# Patient Record
Sex: Male | Born: 1980 | Race: White | Hispanic: No | Smoking: Never smoker
Health system: Southern US, Community
[De-identification: ages and names within clinical notes are randomized; demographics above are authoritative.]

## PROBLEM LIST (undated history)

## (undated) DIAGNOSIS — R569 Unspecified convulsions: Principal | ICD-10-CM

## (undated) DIAGNOSIS — F329 Major depressive disorder, single episode, unspecified: Secondary | ICD-10-CM

## (undated) DIAGNOSIS — F32A Depression, unspecified: Secondary | ICD-10-CM

## (undated) DIAGNOSIS — F419 Anxiety disorder, unspecified: Secondary | ICD-10-CM

## (undated) HISTORY — DX: Depression, unspecified: F32.A

## (undated) HISTORY — DX: Unspecified convulsions: R56.9

## (undated) HISTORY — DX: Major depressive disorder, single episode, unspecified: F32.9

## (undated) HISTORY — DX: Anxiety disorder, unspecified: F41.9

---

## 2004-09-27 HISTORY — PX: CYST REMOVAL NECK: SHX6281

## 2017-03-07 ENCOUNTER — Ambulatory Visit (INDEPENDENT_AMBULATORY_CARE_PROVIDER_SITE_OTHER): Payer: BLUE CROSS/BLUE SHIELD | Admitting: Physician Assistant

## 2017-03-07 ENCOUNTER — Encounter: Payer: Self-pay | Admitting: Physician Assistant

## 2017-03-07 VITALS — BP 130/86 | HR 74 | Temp 98.3°F | Ht 69.5 in | Wt 212.0 lb

## 2017-03-07 DIAGNOSIS — Z23 Encounter for immunization: Secondary | ICD-10-CM

## 2017-03-07 DIAGNOSIS — Z114 Encounter for screening for human immunodeficiency virus [HIV]: Secondary | ICD-10-CM

## 2017-03-07 DIAGNOSIS — F419 Anxiety disorder, unspecified: Secondary | ICD-10-CM | POA: Diagnosis not present

## 2017-03-07 DIAGNOSIS — F321 Major depressive disorder, single episode, moderate: Secondary | ICD-10-CM

## 2017-03-07 LAB — CBC WITH DIFFERENTIAL/PLATELET
BASOS ABS: 0 10*3/uL (ref 0.0–0.1)
Basophils Relative: 0.6 % (ref 0.0–3.0)
EOS ABS: 0 10*3/uL (ref 0.0–0.7)
Eosinophils Relative: 0.7 % (ref 0.0–5.0)
HEMATOCRIT: 44.9 % (ref 39.0–52.0)
HEMOGLOBIN: 15.5 g/dL (ref 13.0–17.0)
LYMPHS PCT: 24.4 % (ref 12.0–46.0)
Lymphs Abs: 1.5 10*3/uL (ref 0.7–4.0)
MCHC: 34.6 g/dL (ref 30.0–36.0)
MCV: 94.4 fl (ref 78.0–100.0)
MONO ABS: 0.4 10*3/uL (ref 0.1–1.0)
Monocytes Relative: 6.6 % (ref 3.0–12.0)
NEUTROS ABS: 4 10*3/uL (ref 1.4–7.7)
Neutrophils Relative %: 67.7 % (ref 43.0–77.0)
PLATELETS: 246 10*3/uL (ref 150.0–400.0)
RBC: 4.76 Mil/uL (ref 4.22–5.81)
RDW: 13.4 % (ref 11.5–15.5)
WBC: 5.9 10*3/uL (ref 4.0–10.5)

## 2017-03-07 LAB — COMPREHENSIVE METABOLIC PANEL
ALBUMIN: 4.6 g/dL (ref 3.5–5.2)
ALT: 25 U/L (ref 0–53)
AST: 20 U/L (ref 0–37)
Alkaline Phosphatase: 40 U/L (ref 39–117)
BILIRUBIN TOTAL: 0.6 mg/dL (ref 0.2–1.2)
BUN: 14 mg/dL (ref 6–23)
CALCIUM: 10 mg/dL (ref 8.4–10.5)
CO2: 29 mEq/L (ref 19–32)
CREATININE: 1.02 mg/dL (ref 0.40–1.50)
Chloride: 103 mEq/L (ref 96–112)
GFR: 89.08 mL/min (ref >60.00)
Glucose, Bld: 108 mg/dL — ABNORMAL HIGH (ref 70–99)
Potassium: 4.1 mEq/L (ref 3.5–5.1)
Sodium: 140 mEq/L (ref 135–145)
Total Protein: 7.5 g/dL (ref 6.0–8.3)

## 2017-03-07 LAB — TSH: TSH: 1.07 u[IU]/mL (ref 0.35–4.50)

## 2017-03-07 MED ORDER — SERTRALINE HCL 25 MG PO TABS: 25.0000 mg | ORAL_TABLET | Freq: Every day | ORAL | 0 refills | Status: DC

## 2017-03-07 NOTE — Patient Instructions (Addendum)
It was great meeting you today!  We will call you with your lab results.  Based on today's visit I would like for you to: 1. Start taking 25 mg zoloft daily. Take before bed. After one week, you may increase to 50 mg, if well tolerated. 2. Make an appointment with your therapist! 3. Work on reaching out to friends and limiting any excessive alcohol intake.  Follow-up with me in 2-3 weeks, sooner if needed.  If you are feeling suicidal, please call [phone] and go to the emergency room!

## 2017-03-07 NOTE — Progress Notes (Signed)
Jordan Ryan is a 36 y.o. male here for to Establish Care.  I acted as a Neurosurgeonscribe for [employer] CorporationSamantha Halima Fogal, PA-C Corky Mullonna Orphanos, LPN  History of Present Illness:   Chief Complaint  Patient presents with  . Establish Care  . Anxiety    panic attacks  . Depression   Acute Concerns: Depression -- prior to separation in January, was doing well with his mood, no prior history of depression or anxiety. He never had issues with motivation. Now he reports that if he does not have his kids, he has difficulty getting out of bed, he does not go to the gym anymore, he has increased his ETOH intake. Drinks about 10-12 a week, no blackouts. He denies suicidal thoughts. He does have a good support system. He has had difficulty concentrating at work. He has been going to therapy twice a week since January however he has not over the past month.  Anxiety -- last Thursday had some shaking of arms, heart palpitations, SOB, tunnel vision, occurred at his daughter's graduation. This also occurred after his wife told him that she was leaving him. Has not had any further panic attacks since.  Chronic Issues: None  Health Maintenance: Immunizations -- needs Tdap Sleep habits -- sleeping is erratic since January, has not taken anything to help him sleep Exercise -- variable, occasional hiking, doesn't really use his gym membership Weight -- Weight: 212 lb (96.2 kg) -- 195 lb is ideal weight that he would like to be at  Depression screen PHQ 2/9 03/07/2017  Decreased Interest 2  Down, Depressed, Hopeless 2  PHQ - 2 Score 4  Altered sleeping 3  Tired, decreased energy 3  Change in appetite 2  Feeling bad or failure about yourself  3  Trouble concentrating 2  Moving slowly or fidgety/restless 0  Suicidal thoughts 0  PHQ-9 Score 17   GAD 7 : Generalized Anxiety Score 03/07/2017  Nervous, Anxious, on Edge 2  Control/stop worrying 2  Worry too much - different things 2  Trouble relaxing 3  Restless 0  Easily  annoyed or irritable 1  Afraid - awful might happen 0  Total GAD 7 Score 10  Anxiety Difficulty Very difficult     Other providers/specialists: January has been seeing therapist -- Lendon ColonelSharon Deach   PMHx, SurgHx, SocialHx, Medications, and Allergies were reviewed in the Visit Navigator and updated as appropriate.  Current Medications:   Current Outpatient Prescriptions:  .  acetaminophen (TYLENOL) 500 MG tablet, Take 500 mg by mouth as needed., Disp: , Rfl:  .  ibuprofen (ADVIL,MOTRIN) 200 MG tablet, Take 400 mg by mouth every 6 (six) hours as needed., Disp: , Rfl:  .  OVER THE COUNTER MEDICATION, Take 1 packet by mouth daily. [employer] Sport Pack Vitamins, Disp: , Rfl:  .  sertraline (ZOLOFT) 25 MG tablet, Take 1 tablet (25 mg total) by mouth daily., Disp: 30 tablet, Rfl: 0   Review of Systems:   Review of Systems  Constitutional: Positive for malaise/fatigue. Negative for chills, fever and weight loss.  HENT: Negative for hearing loss, sinus pain and sore throat.   Eyes: Negative for blurred vision.  Respiratory: Negative for cough and shortness of breath.   Cardiovascular: Negative for chest pain, palpitations and leg swelling.  Gastrointestinal: Negative for abdominal pain, constipation, diarrhea, heartburn and nausea.  Genitourinary: Negative for dysuria, frequency and urgency.  Musculoskeletal: Negative for back pain and myalgias.       RIght side neck when wakes up  in the morning.  Skin: Negative for itching and rash.  Neurological: Positive for tingling. Negative for dizziness, seizures, loss of consciousness and headaches.       During panic attacks  Endo/Heme/Allergies: Negative for polydipsia.  Psychiatric/Behavioral: Positive for depression. Negative for hallucinations and suicidal ideas. The patient is nervous/anxious and has insomnia.        Panic attacks    Vitals:   Vitals:   03/07/17 1436  BP: 130/86  Pulse: 74  Temp: 98.3 F (36.8 C)  TempSrc: Oral  SpO2: 97%   Weight: 212 lb (96.2 kg)  Height: 5' 9.5" (1.765 m)     Body mass index is 30.86 kg/m.  Physical Exam:   Physical Exam  Constitutional: He appears well-developed. He is cooperative.  Non-toxic appearance. He does not have a sickly appearance. He does not appear ill. No distress.  Cardiovascular: Normal rate, regular rhythm, S1 normal, S2 normal, normal heart sounds and normal pulses.   No LE edema  Pulmonary/Chest: Effort normal and breath sounds normal.  Neurological: He is alert.  Psychiatric: His speech is normal and behavior is normal. Thought content normal. His mood appears anxious. He exhibits a depressed mood.  Tearful throughout encounter  Nursing note and vitals reviewed.    Assessment and Plan:    Jordan Ryan was seen today for establish care, anxiety and depression.  Diagnoses and all orders for this visit:  Anxiety Patient is agreeable to trying Zoloft. We discussed use of support system, exercise and reduction of alcohol intake. Follow-up in 2-3 weeks. We discussed avoiding use of benzos for concerns of dependency. Will also order routine labs. -     Comprehensive metabolic panel -     CBC with Differential/Platelet -     TSH  Depression, major, single episode, moderate (HCC) Start Zoloft 25 mg. After 1 week, may increase to 50 mg. Follow-up with me in 2-3 weeks. Start going back to therapy. Work on decreasing alcohol intake. Currently not suicidal. Follow-up with me in 2-3 weeks. Sooner if needed. If suicidal thoughts occur, please go to the ER, suicide hotline number provided. Will also order routine labs. -     Comprehensive metabolic panel -     CBC with Differential/Platelet -     TSH  Encounter for screening for HIV -     HIV antibody  Need for prophylactic vaccination with combined diphtheria-tetanus-pertussis (DTP) vaccine -     Tdap vaccine greater than or equal to 7yo IM  Other orders -     sertraline (ZOLOFT) 25 MG tablet; Take 1 tablet (25 mg total)  by mouth daily.    . Reviewed expectations re: course of current medical issues. . Discussed self-management of symptoms. . Outlined signs and symptoms indicating need for more acute intervention. . Patient verbalized understanding and all questions were answered. . See orders for this visit as documented in the electronic medical record. . Patient received an After-Visit Summary.  CMA or LPN served as scribe during this visit. History, Physical, and Plan performed by medical provider. Documentation and orders reviewed and attested to.  Jarold Motto, PA-C

## 2017-03-08 LAB — HIV ANTIBODY (ROUTINE TESTING W REFLEX): HIV 1&2 Ab, 4th Generation: NONREACTIVE

## 2017-03-28 ENCOUNTER — Encounter: Payer: Self-pay | Admitting: Physician Assistant

## 2017-03-28 ENCOUNTER — Ambulatory Visit (INDEPENDENT_AMBULATORY_CARE_PROVIDER_SITE_OTHER): Payer: BLUE CROSS/BLUE SHIELD | Admitting: Physician Assistant

## 2017-03-28 DIAGNOSIS — F32A Depression, unspecified: Secondary | ICD-10-CM | POA: Insufficient documentation

## 2017-03-28 DIAGNOSIS — F419 Anxiety disorder, unspecified: Secondary | ICD-10-CM | POA: Insufficient documentation

## 2017-03-28 DIAGNOSIS — F329 Major depressive disorder, single episode, unspecified: Secondary | ICD-10-CM

## 2017-03-28 MED ORDER — SERTRALINE HCL 25 MG PO TABS: 25.0000 mg | ORAL_TABLET | Freq: Every day | ORAL | 5 refills | Status: DC

## 2017-03-28 NOTE — Progress Notes (Signed)
Jordan Ryan is a 36 y.o. male is here for 3 week follow up for Anxiety and Depression.  I acted as a Neurosurgeon for [employer], PA-C Corky Mull, LPN  History of Present Illness:   Chief Complaint  Patient presents with  . Follow-up    3 weeks  . Anxiety  . Depression    Anxiety  Presents for follow-up (Pt here today for 3 week ) visit. Patient reports no chest pain, decreased concentration, depressed mood, dizziness, dry mouth, excessive worry, hyperventilation, insomnia, palpitations, panic, restlessness or suicidal ideas. Primary symptoms comment: Pt is feeling much better since on medicaiton Zoloft symptoms have resolved , sleeping much better.. Symptoms occur rarely. The severity of symptoms is mild. The quality of sleep is good. Nighttime awakenings: none.   Compliance with medications: Pt has missed 5 or 6 doses the past 30 days. Treatment side effects: He is tolerating Zoloft well with out any side effects.  Depression       (Pt is feeling much better since on medicaiton Zoloft symptoms have resolved , sleeping much better.)  This is a recurrent problem.  The current episode started more than 1 month ago.   The problem has been rapidly improving since onset.  Associated symptoms include no decreased concentration, no fatigue, no helplessness, no hopelessness, does not have insomnia, not irritable, no restlessness, no decreased interest, no appetite change, no headaches, no indigestion, not sad and no suicidal ideas.     The symptoms are aggravated by family issues and work stress.  Compliance with treatment is good.  Previous treatment provided significant relief.  Past medical history includes anxiety.    Wt Readings from Last 3 Encounters:  03/28/17 210 lb (95.3 kg)  03/07/17 212 lb (96.2 kg)   Since last seeing me has has decreased ETOH intake, started to go back to the gym and has been hanging out with friends.  Depression screen [facility] 2/9 03/28/2017 03/07/2017  Decreased  Interest 0 2  Down, Depressed, Hopeless 0 2  PHQ - 2 Score 0 4  Altered sleeping 0 3  Tired, decreased energy 0 3  Change in appetite 0 2  Feeling bad or failure about yourself  0 3  Trouble concentrating 0 2  Moving slowly or fidgety/restless 0 0  Suicidal thoughts 0 0  PHQ-9 Score 0 17   GAD 7 : Generalized Anxiety Score 03/28/2017 03/07/2017  Nervous, Anxious, on Edge 0 2  Control/stop worrying 0 2  Worry too much - different things 0 2  Trouble relaxing 0 3  Restless 0 0  Easily annoyed or irritable 1 1  Afraid - awful might happen 0 0  Total GAD 7 Score 1 10  Anxiety Difficulty Not difficult at all Very difficult      There are no preventive care reminders to display for this patient.  Past Medical History:  Diagnosis Date  . Anxiety   . Depression      Social History   Social History  . Marital status: Divorced    Spouse name: N/A  . Number of children: N/A  . Years of education: N/A   Occupational History  . Not on file.   Social History Main Topics  . Smoking status: Never Smoker  . Smokeless tobacco: Former Neurosurgeon    Quit date: 09/28/2011  . Alcohol use 6.0 - 7.2 oz/week    10 - 12 Cans of beer per week  . Drug use: No  . Sexual activity: Yes  Other Topics Concern  . Not on file   Social History Narrative   Advertising account planner   From [place]    Going through a divorce   Twin 7 year olds and 34 year old girls   Fun: play guitar, hiking, distractions    Past Surgical History:  Procedure Laterality Date  . CYST REMOVAL NECK Left 2006   epidermal inclusion cyst    Family History  Problem Relation Age of Onset  . Aneurysm Mother   . Depression Mother   . Depression Brother   . Diabetes Maternal Grandfather     PMHx, SurgHx, SocialHx, FamHx, Medications, and Allergies were reviewed in the Visit Navigator and updated as appropriate.   Patient Active Problem List   Diagnosis Date Noted  . Anxiety 03/28/2017  . Depression 03/28/2017     Social History  Substance Use Topics  . Smoking status: Never Smoker  . Smokeless tobacco: Former Neurosurgeon    Quit date: 09/28/2011  . Alcohol use 6.0 - 7.2 oz/week    10 - 12 Cans of beer per week    Current Medications and Allergies:    Current Outpatient Prescriptions:  .  acetaminophen (TYLENOL) 500 MG tablet, Take 500 mg by mouth as needed., Disp: , Rfl:  .  ibuprofen (ADVIL,MOTRIN) 200 MG tablet, Take 400 mg by mouth every 6 (six) hours as needed., Disp: , Rfl:  .  OVER THE COUNTER MEDICATION, Take 1 packet by mouth daily. [employer] Sport Pack Vitamins, Disp: , Rfl:  .  sertraline (ZOLOFT) 25 MG tablet, Take 1 tablet (25 mg total) by mouth daily., Disp: 30 tablet, Rfl: 5  No Known Allergies  Review of Systems   Review of Systems  Constitutional: Negative for appetite change and fatigue.  Cardiovascular: Negative for chest pain and palpitations.  Neurological: Negative for dizziness and headaches.  Psychiatric/Behavioral: Positive for depression. Negative for decreased concentration and suicidal ideas. The patient does not have insomnia.     Vitals:   Vitals:   03/28/17 1603  BP: 130/90  Pulse: 73  Temp: 98.9 F (37.2 C)  TempSrc: Oral  SpO2: 97%  Weight: 210 lb (95.3 kg)  Height: 5' 9.5" (1.765 m)     Body mass index is 30.57 kg/m.   Physical Exam:    Physical Exam  Constitutional: He appears well-developed. He is not irritable and cooperative.  Non-toxic appearance. He does not have a sickly appearance. He does not appear ill. No distress.  Cardiovascular: Normal rate, regular rhythm, S1 normal, S2 normal, normal heart sounds and normal pulses.   No LE edema  Pulmonary/Chest: Effort normal and breath sounds normal.  Neurological: He is alert.  Psychiatric: He has a normal mood and affect. His speech is normal and behavior is normal.  Pleasant and cheerful  Nursing note and vitals reviewed.    Assessment and Plan:    Problem List Items Addressed This  Visit      Other   Anxiety    Tolerating 25 mg Zoloft well. GAD-7 today is a 1, was a 10 about 3-4 weeks ago. Continue treatment. Follow-up in 6 months, sooner if needed.      Relevant Medications   sertraline (ZOLOFT) 25 MG tablet   Depression    Tolerating 25 mg Zoloft. PHQ-9 was 17 at prior visit approximately 4 weeks ago, now is 0. Follow-up in 6 months, sooner if needed.      Relevant Medications   sertraline (ZOLOFT) 25 MG tablet       .  Reviewed expectations re: course of current medical issues. . Discussed self-management of symptoms. . Outlined signs and symptoms indicating need for more acute intervention. . Patient verbalized understanding and all questions were answered. . See orders for this visit as documented in the electronic medical record. . Patient received an After Visit Summary.  CMA or LPN served as scribe during this visit. History, Physical, and Plan performed by medical provider. Documentation and orders reviewed and attested to.  Jarold MottoSamantha Hilarie Sinha, PA-C [place] [place] 03/28/2017  Follow-up: Return in about 6 months (around 09/28/2017) for anxiety.

## 2017-03-28 NOTE — Assessment & Plan Note (Signed)
Tolerating 25 mg Zoloft. PHQ-9 was 17 at prior visit approximately 4 weeks ago, now is 0. Follow-up in 6 months, sooner if needed.

## 2017-03-28 NOTE — Assessment & Plan Note (Signed)
Tolerating 25 mg Zoloft well. GAD-7 today is a 1, was a 10 about 3-4 weeks ago. Continue treatment. Follow-up in 6 months, sooner if needed.

## 2017-03-28 NOTE — Patient Instructions (Signed)
It was great to see you!  Please make an appointment to see us in about 6 months, sooner if needed.  If you decide to go off of the zoloft, split the tablet in half and take half of a tablet daily for 1 week, and then take 1/2 tab every other day for one week, then discontinue.  If you decide to increase zoloft, you can double your dosage to 50 mg daily. Let me know if you do this and if you would like a new prescription for this.  You will be contacted about your referral to general surgery.

## 2017-05-16 ENCOUNTER — Telehealth: Payer: Self-pay | Admitting: Physician Assistant

## 2017-05-16 ENCOUNTER — Other Ambulatory Visit: Payer: Self-pay | Admitting: Physician Assistant

## 2017-05-16 MED ORDER — SERTRALINE HCL 50 MG PO TABS: 50.0000 mg | ORAL_TABLET | Freq: Every day | ORAL | 1 refills | Status: DC

## 2017-05-16 NOTE — Telephone Encounter (Signed)
Called and left detailed message on voicemail.

## 2017-05-16 NOTE — Telephone Encounter (Signed)
I have sent in a new prescription of 50 mg daily Zoloft.  If this is ineffective for his anxiety, and he continues to have breakthrough anxiety with this medication, I would like for him to make an office visit with me.  Thanks!  Jarold Motto PA-C, 05/16/17

## 2017-05-16 NOTE — Telephone Encounter (Signed)
MEDICATION: sertraline (ZOLOFT) 25 MG tablet  PHARMACY:  [id number] - [place], [place] - [address] [phone] (Phone) [phone] (Fax)   IS THIS A 90 DAY SUPPLY : yes  IS PATIENT OUT OF MEDICTAION: yes  IF NOT; HOW MUCH IS LEFT: yes  LAST APPOINTMENT DATE:03/28/17  NEXT APPOINTMENT DATE:09/29/16  OTHER COMMENTS: patient is requesting a higher dosage, thinks he needs to take 2 to avoid panic attacks. Call to advise if necessary.   **Let patient know to contact pharmacy at the end of the day to make sure medication is ready. **  ** Please notify patient to allow 48-72 hours to process**  **Encourage patient to contact the pharmacy for refills or they can request refills through [facility]**

## 2017-08-22 ENCOUNTER — Other Ambulatory Visit: Payer: Self-pay | Admitting: *Deleted

## 2017-08-22 MED ORDER — SERTRALINE HCL 50 MG PO TABS: 50.0000 mg | ORAL_TABLET | Freq: Every day | ORAL | 0 refills | Status: DC

## 2017-09-29 ENCOUNTER — Ambulatory Visit: Payer: BLUE CROSS/BLUE SHIELD | Admitting: Physician Assistant

## 2017-12-05 ENCOUNTER — Encounter: Payer: Self-pay | Admitting: Physician Assistant

## 2017-12-05 ENCOUNTER — Ambulatory Visit: Payer: BLUE CROSS/BLUE SHIELD | Admitting: Physician Assistant

## 2017-12-05 VITALS — BP 148/98 | HR 93 | Temp 97.9°F | Ht 69.5 in | Wt 234.0 lb

## 2017-12-05 DIAGNOSIS — R229 Localized swelling, mass and lump, unspecified: Secondary | ICD-10-CM

## 2017-12-05 DIAGNOSIS — F321 Major depressive disorder, single episode, moderate: Secondary | ICD-10-CM

## 2017-12-05 DIAGNOSIS — F419 Anxiety disorder, unspecified: Secondary | ICD-10-CM

## 2017-12-05 MED ORDER — SERTRALINE HCL 50 MG PO TABS: 50.0000 mg | ORAL_TABLET | Freq: Every day | ORAL | 1 refills | Status: DC

## 2017-12-05 NOTE — Progress Notes (Signed)
Jordan Ryan is a 37 y.o. male is here to discuss: Anxiety  I acted as a Neurosurgeon for [employer], PA-C Jordan Mull, LPN  History of Present Illness:   Chief Complaint  Patient presents with  . Anixety  . Depression    Anxiety  Presents for follow-up visit. Symptoms include decreased concentration, depressed mood, dry mouth, insomnia, malaise and restlessness. Patient reports no chest pain, compulsions, confusion, dizziness, excessive worry, feeling of choking, irritability, muscle tension, nausea, nervous/anxious behavior, obsessions, palpitations, panic, shortness of breath or suicidal ideas. Symptoms occur rarely. The most recent episode lasted 2 hours. The severity of symptoms is mild. The quality of sleep is good. Nighttime awakenings: occasional.   His past medical history is significant for depression. Compliance with medications is 26-50%.  Depression       The patient presents with depression.  This is a chronic problem.  The problem occurs intermittently.  The problem has been gradually improving since onset.  Associated symptoms include decreased concentration, fatigue, insomnia and restlessness.  Associated symptoms include no helplessness, no hopelessness, not irritable, no decreased interest, no appetite change, no body aches, no myalgias, no headaches, no indigestion, not sad and no suicidal ideas.     The symptoms are aggravated by family issues.  Compliance with treatment is variable.  Past compliance problems: Pt is forgetting to take medication everyday and feels sometimes like he does not need it.  Previous treatment provided moderate relief.  Past medical history includes anxiety and depression.    Bumps Patient reports that he has fatty cysts on his R forearm and back. We briefly discussed this during July 2018 visit but a referral to general surgery was not placed. He would like evaluation of these for possible excision. They are non-tender. Denies unintentional  weight loss, fever, night sweats, unusual fatigue.  He has been working on avoiding alcohol. Ways to decompress include: watching TV.  He does note that he struggles with a little bit of compulsive tendencies, especially when it comes to keeping his house clean. States that his ex-wife had OCD tendencies when it came to cleaning the house and he is trying to be a little more relaxed about it.   Depression screen [facility] 2/9 12/05/2017 03/28/2017 03/07/2017  Decreased Interest 1 0 2  Down, Depressed, Hopeless 1 0 2  PHQ - 2 Score 2 0 4  Altered sleeping 1 0 3  Tired, decreased energy 2 0 3  Change in appetite 1 0 2  Feeling bad or failure about yourself  1 0 3  Trouble concentrating 2 0 2  Moving slowly or fidgety/restless 1 0 0  Suicidal thoughts 0 0 0  PHQ-9 Score 10 0 17  Difficult doing work/chores Somewhat difficult - -   GAD 7 : Generalized Anxiety Score 12/05/2017 03/28/2017 03/07/2017  Nervous, Anxious, on Edge 0 0 2  Control/stop worrying 0 0 2  Worry too much - different things 1 0 2  Trouble relaxing 0 0 3  Restless 0 0 0  Easily annoyed or irritable 1 1 1   Afraid - awful might happen 0 0 0  Total GAD 7 Score 2 1 10   Anxiety Difficulty - Not difficult at all Very difficult      There are no preventive care reminders to display for this patient.  Past Medical History:  Diagnosis Date  . Anxiety   . Depression      Social History   Socioeconomic History  . Marital status: Divorced  Spouse name: Not on file  . Number of children: Not on file  . Years of education: Not on file  . Highest education level: Not on file  Social Needs  . Financial resource strain: Not on file  . Food insecurity - worry: Not on file  . Food insecurity - inability: Not on file  . Transportation needs - medical: Not on file  . Transportation needs - non-medical: Not on file  Occupational History  . Not on file  Tobacco Use  . Smoking status: Never Smoker  . Smokeless tobacco: Former  Engineer, water and Sexual Activity  . Alcohol use: Yes    Alcohol/week: 6.0 - 7.2 oz    Types: 10 - 12 Cans of beer per week  . Drug use: No  . Sexual activity: Yes  Other Topics Concern  . 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 CYST REMOVAL NECK Left 2006   epidermal inclusion cyst    Family History  Problem Relation Age of Onset  . Aneurysm Mother   . Depression Mother   . Depression Brother   . Diabetes Maternal Grandfather     PMHx, SurgHx, SocialHx, FamHx, Medications, and Allergies were reviewed in the Visit Navigator and updated as appropriate.   Patient Active Problem List   Diagnosis Date Noted  . Anxiety 03/28/2017  . Depression 03/28/2017    Social History   Tobacco Use  . Smoking status: Never Smoker  . Smokeless tobacco: Former Engineer, water Use Topics  . Alcohol use: Yes    Alcohol/week: 6.0 - 7.2 oz    Types: 10 - 12 Cans of beer per week  . Drug use: No    Current Medications and Allergies:    Current Outpatient Medications:  .  acetaminophen (TYLENOL) 500 MG tablet, Take 500 mg by mouth as needed., Disp: , Rfl:  .  ibuprofen (ADVIL,MOTRIN) 200 MG tablet, Take 400 mg by mouth every 6 (six) hours as needed., Disp: , Rfl:  .  OVER THE COUNTER MEDICATION, Take 1 packet by mouth daily. [employer] Sport Pack Vitamins, Disp: , Rfl:  .  sertraline (ZOLOFT) 50 MG tablet, Take 1 tablet (50 mg total) by mouth at bedtime., Disp: 90 tablet, Rfl: 1  No Known Allergies  Review of Systems   Review of Systems  Constitutional: Positive for fatigue. Negative for appetite change and irritability.  Respiratory: Negative for shortness of breath.   Cardiovascular: Negative for chest pain and palpitations.  Gastrointestinal: Negative for nausea.  Musculoskeletal:  Negative for myalgias.  Neurological: Negative for dizziness and headaches.  Psychiatric/Behavioral: Positive for decreased concentration and depression. Negative for confusion and suicidal ideas. The patient has insomnia. The patient is not nervous/anxious.     Vitals:   Vitals:   12/05/17 0938  BP: (!) 148/98  Pulse: 93  Temp: 97.9 F (36.6 C)  TempSrc: Oral  SpO2: 98%  Weight: 234 lb (106.1 kg)  Height: 5' 9.5" (1.765 m)     Body mass index is 34.06 kg/m.   Physical Exam:    Physical Exam  Constitutional: He appears well-developed. He is not irritable and cooperative.  Non-toxic appearance. He does not have a sickly appearance. He does not appear ill. No  distress.  Cardiovascular: Normal rate, regular rhythm, S1 normal, S2 normal, normal heart sounds and normal pulses.  No LE edema  Pulmonary/Chest: Effort normal and breath sounds normal.  Neurological: He is alert. GCS eye subscore is 4. GCS verbal subscore is 5. GCS motor subscore is 6.  Skin: Skin is warm, dry and intact.  R anterior forearm with 2 cm subcutaneous nodule  Psychiatric: He has a normal mood and affect. His speech is normal and behavior is normal.  Nursing note and vitals reviewed.    Assessment and Plan:    Ayesha RumpfColin was seen today for anixety and depression.  Diagnoses and all orders for this visit:  Anxiety and Depression, major, single episode, moderate (HCC) Currently tolerating treatment well, however I do think he would benefit from taking the medication daily rather than every other day.  He is agreeable to this. Follow-up in 6 months, sooner if needed.  Skin nodule Possible lipoma. I have put in a surgery evaluation per patient request.  Other orders -     sertraline (ZOLOFT) 50 MG tablet; Take 1 tablet (50 mg total) by mouth at bedtime.    . Reviewed expectations re: course of current medical issues. . Discussed self-management of symptoms. . Outlined signs and symptoms indicating need  for more acute intervention. . Patient verbalized understanding and all questions were answered. . See orders for this visit as documented in the electronic medical record. . Patient received an After Visit Summary.  CMA or LPN served as scribe during this visit. History, Physical, and Plan performed by medical provider. Documentation and orders reviewed and attested to.  Jarold MottoSamantha Kahlel Peake, PA-C [place] 12/05/2017  Follow-up: Return in about 6 months (around 06/07/2018) for medication f/u.

## 2017-12-05 NOTE — Patient Instructions (Signed)
It was great to see you!  You will be contacted about your referral.  Try to take your medication every day, at the same time.

## 2018-02-17 ENCOUNTER — Encounter: Payer: Self-pay | Admitting: Physician Assistant

## 2018-02-17 ENCOUNTER — Ambulatory Visit: Payer: 59 | Admitting: Physician Assistant

## 2018-02-17 VITALS — BP 140/80 | HR 79 | Temp 98.3°F | Ht 69.5 in | Wt 236.0 lb

## 2018-02-17 DIAGNOSIS — F419 Anxiety disorder, unspecified: Secondary | ICD-10-CM | POA: Diagnosis not present

## 2018-02-17 DIAGNOSIS — F329 Major depressive disorder, single episode, unspecified: Secondary | ICD-10-CM

## 2018-02-17 DIAGNOSIS — F32A Depression, unspecified: Secondary | ICD-10-CM

## 2018-02-17 MED ORDER — SERTRALINE HCL 50 MG PO TABS: 50.0000 mg | ORAL_TABLET | Freq: Every day | ORAL | 1 refills | Status: DC

## 2018-02-17 NOTE — Progress Notes (Signed)
Jordan Ryan is a 37 y.o. male is here to discuss: Anxiety and Depression  I acted as a Neurosurgeon for [employer], PA-C Corky Mull, LPN  History of Present Illness:   Chief Complaint  Patient presents with  . Anxiety  . Depression    Anxiety  Presents for follow-up (Pt c/o increase in anxiety past few weeks) visit. Symptoms include confusion, decreased concentration, depressed mood, dry mouth, excessive worry, insomnia, irritability, malaise, nervous/anxious behavior, panic (x 1 since last visit) and restlessness. Patient reports no chest pain, dizziness, muscle tension, nausea, obsessions, shortness of breath or suicidal ideas. Symptoms occur constantly. The most recent episode lasted 3 days. The severity of symptoms is moderate and causing significant distress. The quality of sleep is fair. Nighttime awakenings: occasional.   Compliance with medications: Stopped medication over a month ago. Treatment side effects: None.  Depression         This is a chronic problem.  Episode onset: few weeks ago.   The onset quality is gradual.   The problem occurs constantly.  The most recent episode lasted 3 days.    The problem has been gradually worsening since onset.  Associated symptoms include decreased concentration, fatigue, helplessness, hopelessness, insomnia, irritable, restlessness, decreased interest, appetite change and sad.  Associated symptoms include no body aches, no headaches, no indigestion and no suicidal ideas.     The symptoms are aggravated by family issues and social issues.  Compliance with treatment is poor.  Past medical history includes anxiety.    He reports that he has concerns that he might be bipolar.  He tells me today that his brother is bipolar and his mom had bipolar.  He does endorse issues with having "highs" where he wants to spend lots of money.  For the past 2 days he has been in bed feeling very sad.  His brother convinced him to come see me today.  He  reflects stating that he thinks that the Zoloft was actually helping him.  He denies any recent alcohol binges.  There are no preventive care reminders to display for this patient.   GAD 7 : Generalized Anxiety Score 02/17/2018 12/05/2017 03/28/2017 03/07/2017  Nervous, Anxious, on Edge 2 0 0 2  Control/stop worrying 2 0 0 2  Worry too much - different things 3 1 0 2  Trouble relaxing 2 0 0 3  Restless 0 0 0 0  Easily annoyed or irritable Afraid - awful might happen 1 0 0 0  Total GAD 7 Score Anxiety Difficulty Very difficult - Not difficult at all Very difficult    Depression screen [facility] 2/9 02/17/2018 12/05/2017 03/28/2017 03/07/2017  Decreased Interest 2 1 0 2  Down, Depressed, Hopeless 2 1 0 2  PHQ - 2 Score 4 2 0 4  Altered sleeping 2 1 0 3  Tired, decreased energy 3 2 0 3  Change in appetite 2 1 0 2  Feeling bad or failure about yourself  2 1 0 3  Trouble concentrating 3 2 0 2  Moving slowly or fidgety/restless 2 1 0 0  Suicidal thoughts 0 0 0 0  PHQ-9 Score 18 10 0 17  Difficult doing work/chores Very difficult Somewhat difficult - -   Mood Disorder Questionnaire: 10/13 with "yes"  Past Medical History:  Diagnosis Date  . Anxiety   . Depression      Social History   Socioeconomic History  .  Marital status: Divorced    Spouse name: Not on file  . Number of children: Not on file  . Years of education: Not on file  . Highest education level: Not on file  Occupational History  . Not on file  Social Needs  . Financial resource strain: Not on file  . Food insecurity:    Worry: Not on file    Inability: Not on file  . Transportation needs:    Medical: Not on file    Non-medical: Not on file  Tobacco Use  . Smoking status: Never Smoker  . Smokeless tobacco: Former Engineer, water and Sexual Activity  . Alcohol use: Yes    Alcohol/week: 6.0 - 7.2 oz    Types: 10 - 12 Cans of beer per week  . Drug use: No  . Sexual activity: Yes  Lifestyle  .  Physical activity:    Days per week: Not on file    Minutes per session: Not on file  . Stress: Not on file  Relationships  . Social connections:    Talks on phone: Not on file    Gets together: Not on file    Attends religious service: Not on file    Active member of club or organization: Not on file    Attends meetings of clubs or organizations: Not on file    Relationship status: Not on file  . Intimate partner violence:    Fear of current or ex partner: Not on file    Emotionally abused: Not on file    Physically abused: Not on file    Forced sexual activity: Not on file  Other Topics Concern  . 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 CYST REMOVAL NECK Left 2006   epidermal inclusion cyst    Family History  Problem Relation Age of Onset  . Aneurysm Mother   . Depression Mother   . Depression Brother   . Diabetes Maternal Grandfather     PMHx, SurgHx, SocialHx, FamHx, Medications, and Allergies were reviewed in the Visit Navigator and updated as appropriate.   Patient Active Problem List   Diagnosis Date Noted  . Anxiety 03/28/2017  . Depression 03/28/2017    Social History   Tobacco Use  . Smoking status: Never Smoker  . Smokeless tobacco: Former Engineer, water Use Topics  . Alcohol use: Yes    Alcohol/week: 6.0 - 7.2 oz    Types: 10 - 12 Cans of beer per week  . Drug use: No    Current Medications and Allergies:    Current Outpatient Medications:  .  acetaminophen (TYLENOL) 500 MG tablet, Take 500 mg by mouth as needed., Disp: , Rfl:  .  ibuprofen (ADVIL,MOTRIN) 200 MG tablet, Take 400 mg by mouth every 6 (six) hours as needed., Disp: , Rfl:  .  sertraline (ZOLOFT) 50 MG tablet, Take 1 tablet (50 mg total) by mouth at bedtime., Disp: 30 tablet, Rfl:  1  No Known Allergies  Review of Systems   Review of Systems  Constitutional: Positive for appetite change, fatigue and irritability.  Respiratory: Negative for shortness of breath.   Cardiovascular: Negative for chest pain.  Gastrointestinal: Negative for nausea.  Neurological: Negative for dizziness and  headaches.  Psychiatric/Behavioral: Positive for confusion, decreased concentration and depression. Negative for suicidal ideas. The patient is nervous/anxious and has insomnia.     Vitals:   Vitals:   02/17/18 0854  BP: 140/80  Pulse: 79  Temp: 98.3 F (36.8 C)  TempSrc: Oral  SpO2: 95%  Weight: 236 lb (107 kg)  Height: 5' 9.5" (1.765 m)     Body mass index is 34.35 kg/m.   Physical Exam:    Physical Exam  Constitutional: He appears well-developed. He is irritable and cooperative.  Non-toxic appearance. He does not have a sickly appearance. He does not appear ill. No distress.  Cardiovascular: Normal rate, regular rhythm, S1 normal, S2 normal, normal heart sounds and normal pulses.  No LE edema  Pulmonary/Chest: Effort normal and breath sounds normal.  Neurological: He is alert. GCS eye subscore is 4. GCS verbal subscore is 5. GCS motor subscore is 6.  Skin: Skin is warm, dry and intact.  Psychiatric: He has a normal mood and affect. His speech is normal and behavior is normal.  Nursing note and vitals reviewed.    Assessment and Plan:    Wissam was seen today for anxiety and depression.  Diagnoses and all orders for this visit:  Anxiety  Depression, unspecified depression type  Other orders -     sertraline (ZOLOFT) 50 MG tablet; Take 1 tablet (50 mg total) by mouth at bedtime.   Patient currently uncontrolled.  Overall he felt very controlled with Zoloft 50 mg, we are going to resume this today.  I told him that he needs to take this medication at the same time every single day.  He understands and is agreeable to this.  He also filled out the mood  disorder questionnaire, and given the fact that he did have 10 out of 13 positive responses, I told him that I think he would benefit from being evaluated by psychiatrist.  I recommended that he make an appointment with a psychiatrist soon for further evaluation and treatment.  Follow-up with me or the psychiatrist within 1 month. I discussed with patient that if they develop any SI, to tell someone immediately and seek medical attention.  . Reviewed expectations re: course of current medical issues. . Discussed self-management of symptoms. . Outlined signs and symptoms indicating need for more acute intervention. . Patient verbalized understanding and all questions were answered. . See orders for this visit as documented in the electronic medical record. . Patient received an After Visit Summary.  CMA or LPN served as scribe during this visit. History, Physical, and Plan performed by medical provider. Documentation and orders reviewed and attested to.  Jarold Motto, PA-C [place] 02/17/2018  Follow-up: No follow-ups on file.

## 2018-02-17 NOTE — Patient Instructions (Addendum)
Please contact a psychiatrist for further evaluation and treatment, I think benefit from being evaluated for a mood disorder.  Let's resumed Zoloft 50 mg daily. Please take at the same time every day.  If you have any suicidal thoughts, please go to the emergency room.  I would like for you to be seen within 1 month by me or the psychiatrist.  If you are unable to get with psychiatry before then please come see me in the meantime. Seeing a psychiatrist is a priority, so I do recommend keeping the appointment even it if seems like a long time from now (can often have a few months wait.)

## 2018-03-06 ENCOUNTER — Telehealth: Payer: Self-pay | Admitting: Physician Assistant

## 2018-03-06 ENCOUNTER — Telehealth: Payer: Self-pay | Admitting: *Deleted

## 2018-03-06 DIAGNOSIS — F419 Anxiety disorder, unspecified: Secondary | ICD-10-CM

## 2018-03-06 DIAGNOSIS — F321 Major depressive disorder, single episode, moderate: Secondary | ICD-10-CM

## 2018-03-06 NOTE — Telephone Encounter (Signed)
Error

## 2018-03-06 NOTE — Telephone Encounter (Signed)
Left message on voicemail to call office. Please see Samantha's message and give to pt.  

## 2018-03-06 NOTE — Telephone Encounter (Signed)
Left message on voicemail to call office.  

## 2018-03-06 NOTE — Telephone Encounter (Signed)
Please contact patient and let him know that at our last visit, I provided him with a list of psychiatrists for him to contact, as typically patients should self schedule these appointments. This is why he has not been contacted. If he needs another list of psychiatrists, we can provide one for him.  I will put in a referral, however to expedite this, I strongly recommend that he contact someone on his own. Please let me know if he has any questions.  Jarold MottoSamantha Edman Lipsey PA-C

## 2018-03-06 NOTE — Addendum Note (Signed)
Addended by: Haynes BastWORLEY, Alexys Lobello J on: 03/06/2018 10:12 AM   Modules accepted: Orders

## 2018-03-06 NOTE — Telephone Encounter (Signed)
Please see message and advise 

## 2018-03-06 NOTE — Telephone Encounter (Signed)
See note.  Copied from CRM [id number]. Topic: Referral - Request >> Mar 06, 2018  7:57 AM Leafy Roobinson, Norma J wrote: Reason for CRM: pt saw worley on 02-17-18 and he is still waiting on the referral to male pyschiatrist .

## 2018-05-11 ENCOUNTER — Telehealth: Payer: Self-pay | Admitting: Physician Assistant

## 2018-05-11 ENCOUNTER — Ambulatory Visit (HOSPITAL_COMMUNITY): Payer: 59 | Admitting: Psychiatry

## 2018-05-11 NOTE — Telephone Encounter (Signed)
Copied from CRM [phone]. Topic: Referral - Status >> May 11, 2018  1:56 PM Luanna Coleawoud, Jessica L wrote: Reason for CRM: pt called and stated that [facility] called and canceled appt because of emergency with provider. PT will now have to wait 2 months for another appt. Pt would like to know if he could get in with someone else faster. Please advise Cb#[phone]

## 2018-05-12 NOTE — Telephone Encounter (Signed)
Please see message and advise 

## 2018-05-12 NOTE — Telephone Encounter (Signed)
Please call patient.  I'm sorry to hear this. Did the behavioral health office offer any other options with other providers?  I have had success with patients getting in rather quickly to [facility]. Phone number is [phone]. If unable to get in sooner with this option, we have a handout with a list of other offices he may call.  Jarold MottoSamantha Jimmy Plessinger PA-C

## 2018-05-12 NOTE — Telephone Encounter (Signed)
Called patient back to give [facility] phone number. I left a message with the number given [phone]. I also stated if he has any other questions to call our office back at [phone]

## 2018-05-12 NOTE — Telephone Encounter (Signed)
Called patient and gave him the option to see if behavioral health could offer any other provider or to call [facility] to see if they could get him in sooner. Patient was driving so I was not able to give him the number at that moment. I will call patient back and give him the number to [facility]. Patient verbalized understanding.

## 2018-06-07 ENCOUNTER — Encounter: Payer: Self-pay | Admitting: Physician Assistant

## 2018-06-07 ENCOUNTER — Ambulatory Visit: Payer: Self-pay | Admitting: Physician Assistant

## 2018-06-07 DIAGNOSIS — Z0289 Encounter for other administrative examinations: Secondary | ICD-10-CM

## 2018-09-06 ENCOUNTER — Other Ambulatory Visit: Payer: Self-pay | Admitting: Physician Assistant

## 2018-09-27 DIAGNOSIS — R569 Unspecified convulsions: Secondary | ICD-10-CM

## 2018-09-27 HISTORY — DX: Unspecified convulsions: R56.9

## 2018-10-30 ENCOUNTER — Emergency Department (HOSPITAL_COMMUNITY)
Admission: EM | Admit: 2018-10-30 | Discharge: 2018-10-30 | Disposition: A | Discharge status: Home / Self Care | Payer: Self-pay | Attending: Emergency Medicine | Admitting: Emergency Medicine

## 2018-10-30 ENCOUNTER — Other Ambulatory Visit: Payer: Self-pay

## 2018-10-30 ENCOUNTER — Encounter (HOSPITAL_COMMUNITY): Payer: Self-pay | Admitting: *Deleted

## 2018-10-30 ENCOUNTER — Emergency Department (HOSPITAL_COMMUNITY): Payer: Self-pay

## 2018-10-30 DIAGNOSIS — F419 Anxiety disorder, unspecified: Secondary | ICD-10-CM | POA: Insufficient documentation

## 2018-10-30 DIAGNOSIS — Z79899 Other long term (current) drug therapy: Secondary | ICD-10-CM | POA: Insufficient documentation

## 2018-10-30 DIAGNOSIS — F329 Major depressive disorder, single episode, unspecified: Secondary | ICD-10-CM | POA: Insufficient documentation

## 2018-10-30 DIAGNOSIS — R569 Unspecified convulsions: Secondary | ICD-10-CM | POA: Insufficient documentation

## 2018-10-30 LAB — URINALYSIS, ROUTINE W REFLEX MICROSCOPIC
Bacteria, UA: NONE SEEN
Bilirubin Urine: NEGATIVE
Glucose, UA: NEGATIVE mg/dL
Hgb urine dipstick: NEGATIVE
Ketones, ur: 80 mg/dL — AB
LEUKOCYTES UA: NEGATIVE
Nitrite: NEGATIVE
Protein, ur: 30 mg/dL — AB
Specific Gravity, Urine: 1.025 (ref 1.005–1.030)
pH: 6 (ref 5.0–8.0)

## 2018-10-30 LAB — BASIC METABOLIC PANEL
ANION GAP: 12 (ref 5–15)
BUN: 9 mg/dL (ref 6–20)
CHLORIDE: 100 mmol/L (ref 98–111)
CO2: 23 mmol/L (ref 22–32)
Calcium: 9.2 mg/dL (ref 8.9–10.3)
Creatinine, Ser: 0.94 mg/dL (ref 0.61–1.24)
GFR calc Af Amer: >60 mL/min (ref >60)
GFR calc non Af Amer: >60 mL/min (ref >60)
Glucose, Bld: 116 mg/dL — ABNORMAL HIGH (ref 70–99)
POTASSIUM: 3.4 mmol/L — AB (ref 3.5–5.1)
Sodium: 135 mmol/L (ref 135–145)

## 2018-10-30 LAB — HEPATIC FUNCTION PANEL
ALT: 50 U/L — ABNORMAL HIGH (ref 0–44)
AST: 52 U/L — ABNORMAL HIGH (ref 15–41)
Albumin: 4.5 g/dL (ref 3.5–5.0)
Alkaline Phosphatase: 46 U/L (ref 38–126)
Bilirubin, Direct: 0.3 mg/dL — ABNORMAL HIGH (ref 0.0–0.2)
Indirect Bilirubin: 1 mg/dL — ABNORMAL HIGH (ref 0.3–0.9)
Total Bilirubin: 1.3 mg/dL — ABNORMAL HIGH (ref 0.3–1.2)
Total Protein: 7.7 g/dL (ref 6.5–8.1)

## 2018-10-30 LAB — RAPID URINE DRUG SCREEN, HOSP PERFORMED
Amphetamines: NOT DETECTED
Barbiturates: NOT DETECTED
Benzodiazepines: NOT DETECTED
Cocaine: NOT DETECTED
OPIATES: NOT DETECTED
Tetrahydrocannabinol: NOT DETECTED

## 2018-10-30 LAB — CBC
HEMATOCRIT: 45.7 % (ref 39.0–52.0)
Hemoglobin: 15.2 g/dL (ref 13.0–17.0)
MCH: 32.1 pg (ref 26.0–34.0)
MCHC: 33.3 g/dL (ref 30.0–36.0)
MCV: 96.6 fL (ref 80.0–100.0)
Platelets: 198 10*3/uL (ref 150–400)
RBC: 4.73 MIL/uL (ref 4.22–5.81)
RDW: 12.1 % (ref 11.5–15.5)
WBC: 9 10*3/uL (ref 4.0–10.5)
nRBC: 0 % (ref 0.0–0.2)

## 2018-10-30 LAB — ETHANOL: Alcohol, Ethyl (B): <10 mg/dL (ref <10)

## 2018-10-30 IMAGING — CT CT HEAD W/O CM
3 series · 15 of 47 positions shown, 18 images · non-contrast
Comparison: None.

CLINICAL DATA: Seizure tonight.

EXAM:
CT HEAD WITHOUT CONTRAST
TECHNIQUE: Contiguous axial images were obtained from the base of the skull
through the vertex without intravenous contrast.

[Series 2: head wo · axial · 0.45mm/px · z∈[-130,+5]mm · 9 of 33 slices shown, 12 images]
[im 3/33  brain]
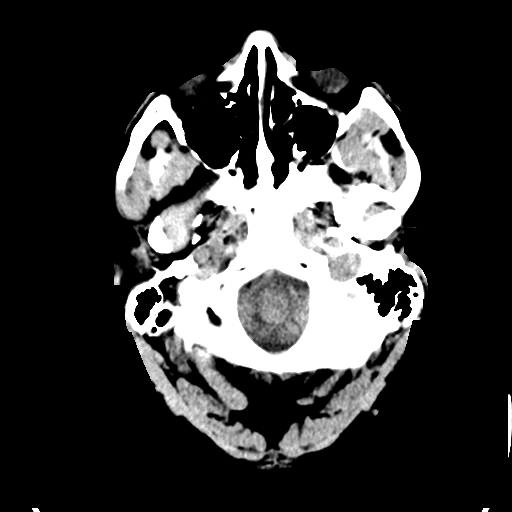
[im 3/33  bone]
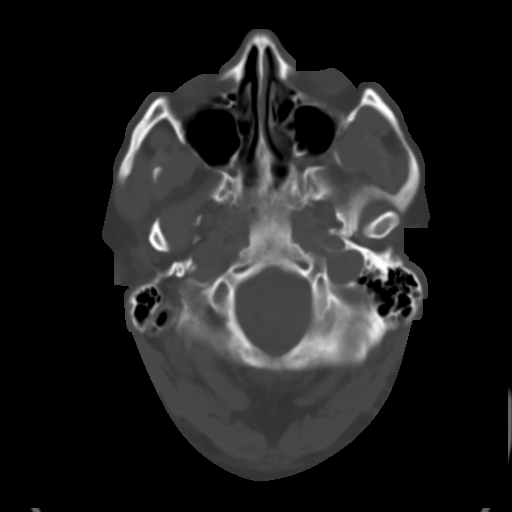
[im 6/33  brain]
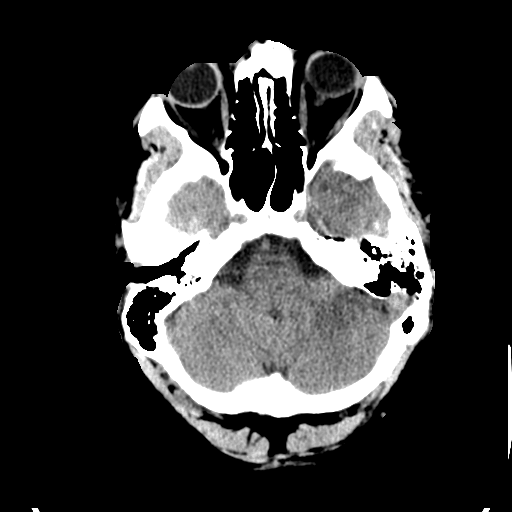
[im 9/33  brain]
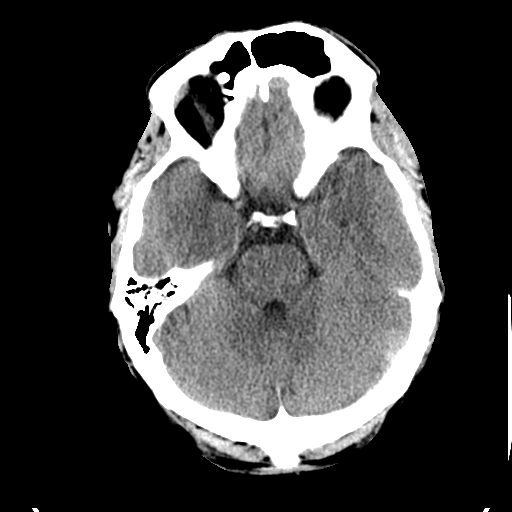
[im 13/33  brain]
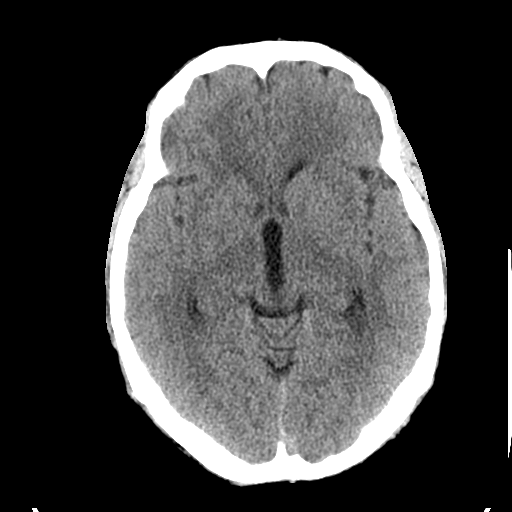
[im 17/33  brain]
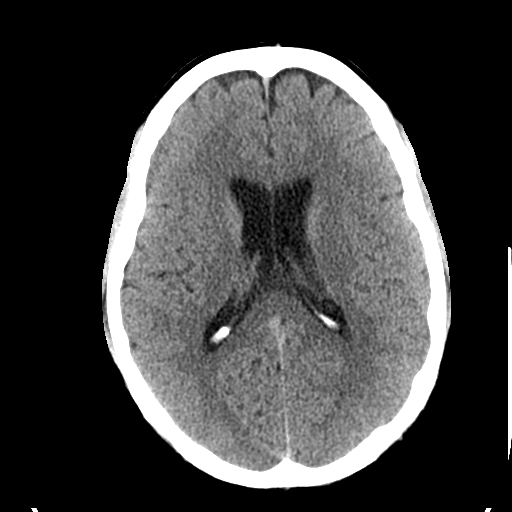
[im 17/33  bone]
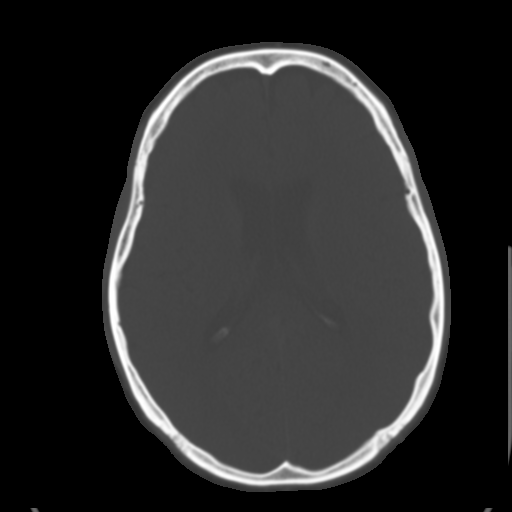
[im 20/33  brain]
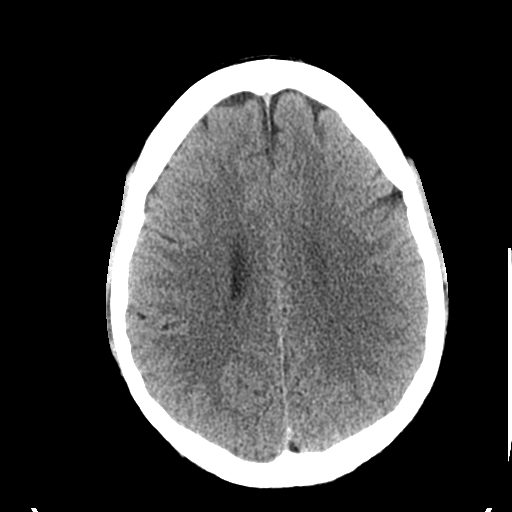
[im 24/33  brain]
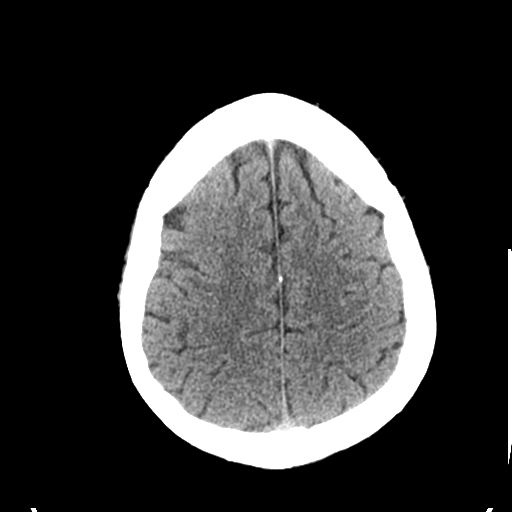
[im 27/33  brain]
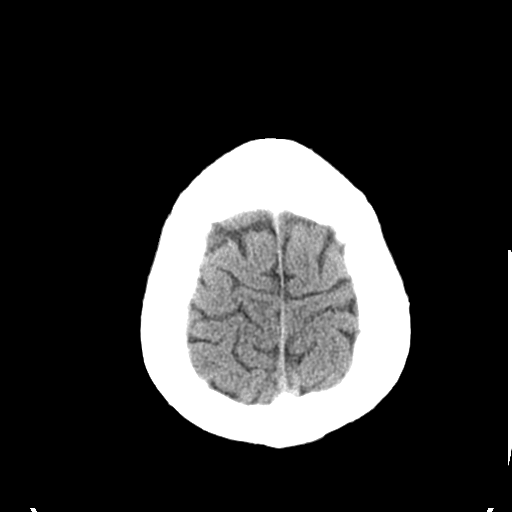
[im 30/33  brain]
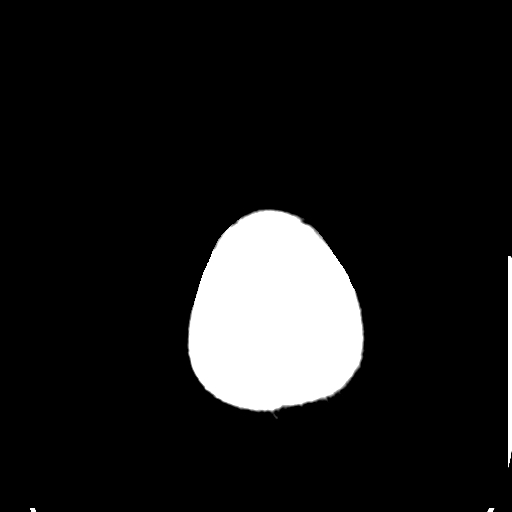
[im 30/33  bone]
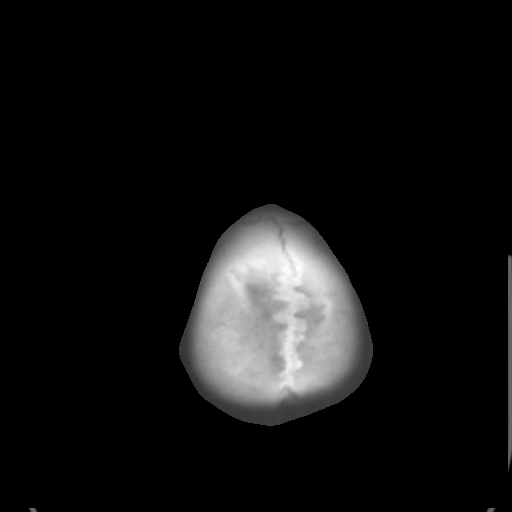

[Series 5: coronal soft tissue · coronal · 0.32mm/px · 3 of 73 slices shown]
[im 25/73  brain]
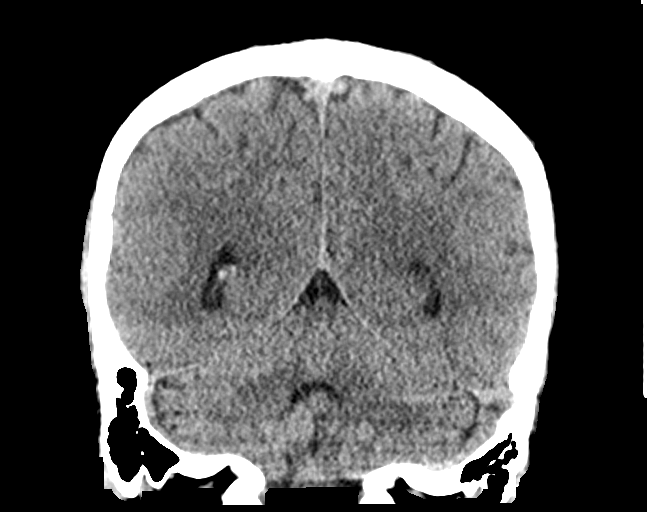
[im 33/73  brain]
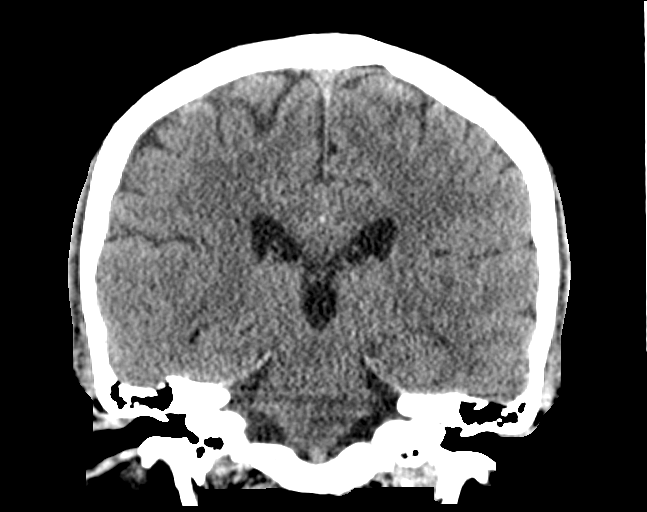
[im 41/73  brain]
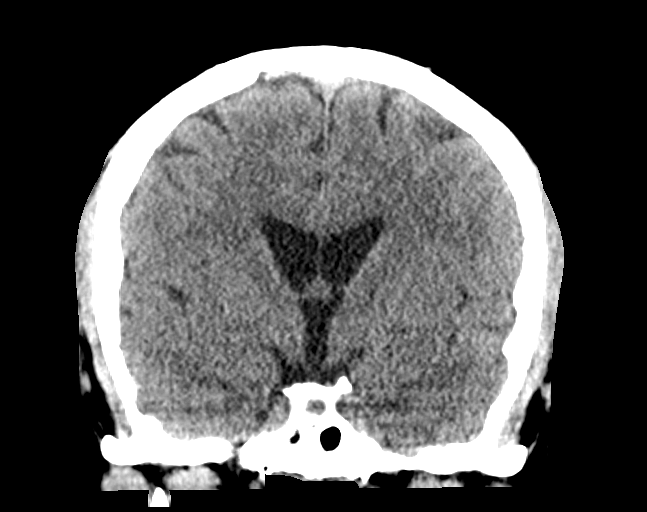

[Series 6: sagittal soft tissue · sagittal · 0.32mm/px · 3 of 69 slices shown]
[im 23/69  brain]
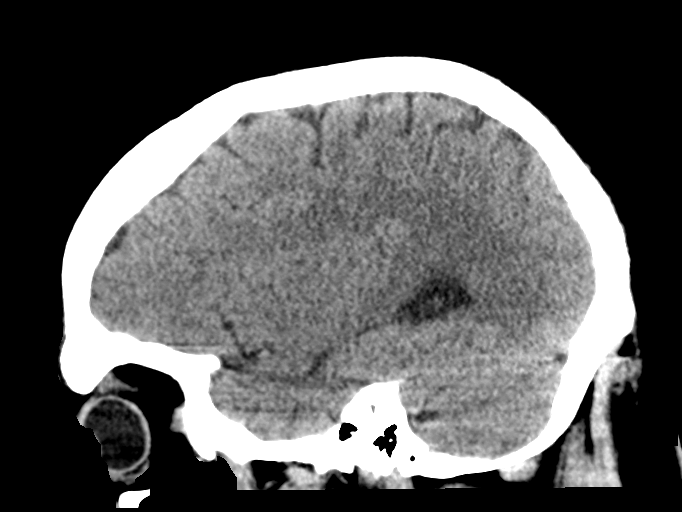
[im 35/69  brain]
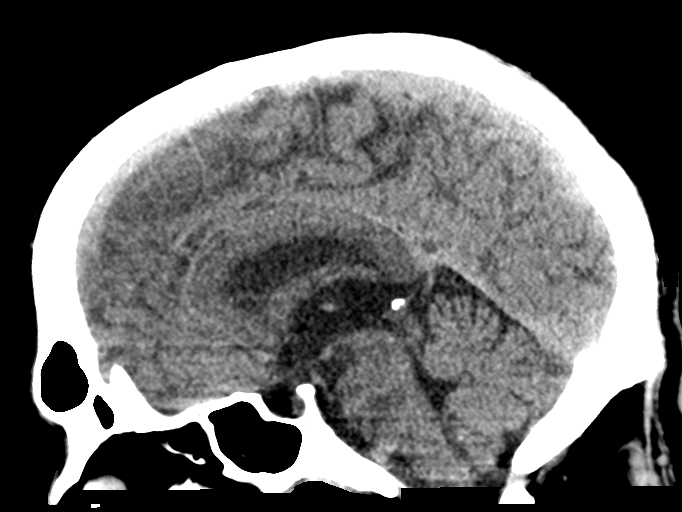
[im 46/69  brain]
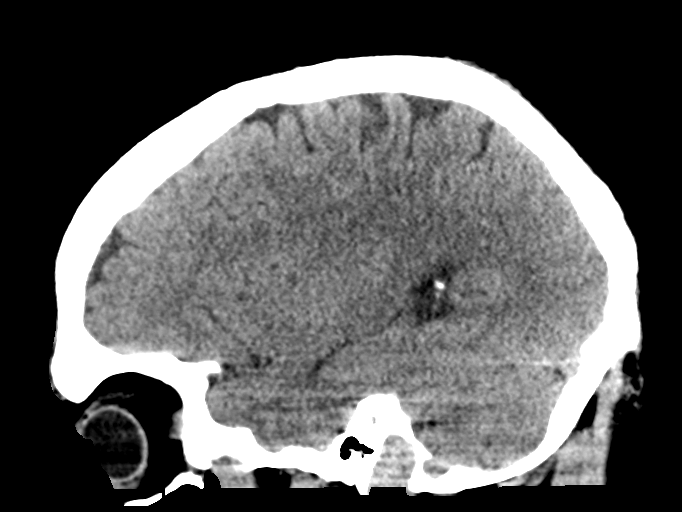

[15 of 47 positions shown; findings below may reference images not displayed]

FINDINGS: Brain: Normal appearing cerebral hemispheres and posterior fossa
structures. Normal size and position of the ventricles. No
intracranial hemorrhage, mass lesion or CT evidence of acute
infarction.

Vascular: No hyperdense vessel or unexpected calcification.

Skull: Normal. Negative for fracture or focal lesion.

Sinuses/Orbits: Unremarkable.

Other: None.
IMPRESSION: Normal examination.

## 2018-10-30 MED ORDER — SODIUM CHLORIDE 0.9% FLUSH: 3.0000 mL | Freq: Once | INTRAVENOUS | Status: DC

## 2018-10-30 NOTE — ED Triage Notes (Signed)
Pt stated "I having been taking Wellbutrin for a couple of months after being dx'd with depression.  I also have anxiety.  Tonight I was cutting apples and then I was on the floor and noticed my kids were crying.  They said I was shaking.  I also bit my tongue.  [facility]ent to [facility] on [address] and told them I hit my head, they told me to come here."

## 2018-10-30 NOTE — ED Provider Notes (Signed)
[facility]-EMERGENCY DEPT Provider Note   CSN: [id number] Arrival date & time: 10/30/18  1858     History   Chief Complaint Chief Complaint  Patient presents with  . Loss of Consciousness    HPI Jordan Ryan is a 38 y.o. male.  HPI Patient presents with syncopal episode.  States he was at his kitchen cutting fruit for dinner with his kids.  [place]  States that his children told him that he had been shaking.  Patient had bit his tongue.  Reportedly it is his head also.  No pain in his face besides the tongue.  No neck or back pain.  No lightheadedness dizziness.  Has been good the last couple days.  Has been on Wellbutrin for the last couple months.  Patient states he was trying to get his doctor to decrease the dose but they kept it what it was.  No headaches.  No confusion. Past Medical History:  Diagnosis Date  . Anxiety   . Depression     Patient Active Problem List   Diagnosis Date Noted  . Anxiety 03/28/2017  . Depression 03/28/2017    Past Surgical History:  Procedure Laterality Date  . CYST REMOVAL NECK Left 2006   epidermal inclusion cyst        Home Medications    Prior to Admission medications   Medication Sig Start Date End Date Taking? Authorizing Provider  buPROPion (WELLBUTRIN XL) 150 MG 24 hr tablet Take 450 mg by mouth daily. 09/25/18  Yes [provider]  sertraline (ZOLOFT) 50 MG tablet TAKE 1 TABLET BY MOUTH EVERYDAY AT BEDTIME Patient not taking: No sig reported 09/06/18   Jarold MottoWorley, Samantha, PA    Family History Family History  Problem Relation Age of Onset  . Aneurysm Mother   . Depression Mother   . Depression Brother   . Diabetes Maternal Grandfather     Social History Social History   Tobacco Use  . Smoking status: Never Smoker  . Smokeless tobacco: Former Engineer, waterUser  Substance Use Topics  . Alcohol use: Yes    Alcohol/week: 10.0 - 12.0 standard  drinks    Types: 10 - 12 Cans of beer per week  . Drug use: No     Allergies   Patient has no known allergies.   Review of Systems Review of Systems  Constitutional: Negative for appetite change.  HENT: Negative for trouble swallowing and voice change.   Respiratory: Negative for shortness of breath.   Cardiovascular: Negative for chest pain.  Gastrointestinal: Negative for abdominal distention.  Endocrine: Negative for polyuria.  Genitourinary: Negative for dysuria.  Musculoskeletal: Negative for back pain.  Skin: Negative for pallor.  Neurological: Positive for syncope. Negative for weakness.  Psychiatric/Behavioral: Negative for confusion and suicidal ideas.       Some depression and anxiety.     Physical Exam Updated Vital Signs BP (!) 144/86 (BP Location: Left Arm)   Pulse 99   Temp 98.1 F (36.7 C) (Oral)   Resp (!) 24   Ht 5\' 10"  (1.778 m)   Wt 99.3 kg   SpO2 99%   BMI 31.42 kg/m   Physical Exam HENT:     Head: Atraumatic.     Mouth/Throat:     Mouth: Mucous membranes are moist.  Eyes:     Pupils: Pupils are equal, round, and reactive to light.  Neck:     Musculoskeletal:  Neck supple.  Cardiovascular:     Rate and Rhythm: Regular rhythm.  Pulmonary:     Effort: Pulmonary effort is normal.  Abdominal:     Palpations: Abdomen is soft.  Skin:    General: Skin is warm.     Capillary Refill: Capillary refill takes less than 2 seconds.  Neurological:     Mental Status: He is alert and oriented to person, place, and time.  Psychiatric:        Mood and Affect: Mood normal.   Tongue has ecchymosis on left side.  No jaw tenderness.   ED Treatments / Results  Labs (all labs ordered are listed, but only abnormal results are displayed) Labs Reviewed  BASIC METABOLIC PANEL - Abnormal; Notable for the following components:      Result Value   Potassium 3.4 (*)    Glucose, Bld 116 (*)    All other components within normal limits  URINALYSIS, ROUTINE W  REFLEX MICROSCOPIC - Abnormal; Notable for the following components:   Color, Urine AMBER (*)    APPearance HAZY (*)    Ketones, ur 80 (*)    Protein, ur 30 (*)    All other components within normal limits  HEPATIC FUNCTION PANEL - Abnormal; Notable for the following components:   AST 52 (*)    ALT 50 (*)    Total Bilirubin 1.3 (*)    Bilirubin, Direct 0.3 (*)    Indirect Bilirubin 1.0 (*)    All other components within normal limits  CBC  RAPID URINE DRUG SCREEN, HOSP PERFORMED  ETHANOL    EKG EKG Interpretation  Date/Time:  Monday October 30 2018 20:15:08 EST Ventricular Rate:  100 PR Interval:    QRS Duration: 104 QT Interval:  342 QTC Calculation: 442 R Axis:   59 Text Interpretation:  Sinus tachycardia Borderline T wave abnormalities Confirmed by Benjiman CorePickering, Hinda Lindor [phone]([id number]) on 10/30/2018 8:48:21 PM   Radiology Ct Head Wo Contrast  Result Date: 10/30/2018 CLINICAL DATA:  Seizure tonight. EXAM: CT HEAD WITHOUT CONTRAST TECHNIQUE: Contiguous axial images were obtained from the base of the skull through the vertex without intravenous contrast. COMPARISON:  None. FINDINGS: Brain: Normal appearing cerebral hemispheres and posterior fossa structures. Normal size and position of the ventricles. No intracranial hemorrhage, mass lesion or CT evidence of acute infarction. Vascular: No hyperdense vessel or unexpected calcification. Skull: Normal. Negative for fracture or focal lesion. Sinuses/Orbits: Unremarkable. Other: None. IMPRESSION: Normal examination. Electronically Signed   By: Beckie SaltsSteven  Reid M.D.   On: 10/30/2018 22:16    Procedures Procedures (including critical care time)  Medications Ordered in ED Medications  sodium chloride flush (NS) 0.9 % injection 3 mL (3 mLs Intravenous Not Given 10/30/18 2038)     Initial Impression / Assessment and Plan / ED Course  I have reviewed the triage vital signs and the nursing notes.  Pertinent labs & imaging results that were available  during my care of the patient were reviewed by me and considered in my medical decision making (see chart for details).     Patient with syncopal episode.  I think likely was a seizure.  Has been on Wellbutrin.  Has evidence of tongue biting on his left lateral tongue.  Head CT reassuring.  Lab work overall reassuring but does have some ketones.  Think likely secondary to seizure.  Discussed patient about not being able to drive.  Needs neurology follow-up.  PCP will help decrease the Wellbutrin.  Discharge home.  Final Clinical  Impressions(s) / ED Diagnoses   Final diagnoses:  Seizure [facility])    ED Discharge Orders    None       Benjiman Core, MD 10/30/18 2242

## 2018-12-26 ENCOUNTER — Telehealth: Payer: Self-pay | Admitting: *Deleted

## 2018-12-26 NOTE — Telephone Encounter (Signed)
Agreed to Webex. 

## 2018-12-28 ENCOUNTER — Encounter: Payer: Self-pay | Admitting: Physician Assistant

## 2018-12-28 ENCOUNTER — Ambulatory Visit (INDEPENDENT_AMBULATORY_CARE_PROVIDER_SITE_OTHER): Payer: Self-pay | Admitting: Physician Assistant

## 2018-12-28 ENCOUNTER — Other Ambulatory Visit: Payer: Self-pay

## 2018-12-28 VITALS — Ht 70.0 in | Wt 219.0 lb

## 2018-12-28 DIAGNOSIS — R569 Unspecified convulsions: Secondary | ICD-10-CM

## 2018-12-28 DIAGNOSIS — F419 Anxiety disorder, unspecified: Secondary | ICD-10-CM

## 2018-12-28 NOTE — Progress Notes (Signed)
Virtual Visit via Video   I connected with Jordan Ryan on 12/28/18 at  8:00 AM EDT by a video enabled telemedicine application and verified that I am speaking with the correct person using two identifiers. Location patient: Home Location provider: [facility], Office Persons participating in the virtual visit: Jordan Ryan, Jordan Ryan, [place]   I discussed the limitations of evaluation and management by telemedicine and the availability of in person appointments. The patient expressed understanding and agreed to proceed.  Subjective:   HPI: ED f/u Pt was seen in the ED on 2/3 for a seizure. Pt had syncope episode at home which involved a seizure. Pt was on Zoloft and Wellbutrin at that time. Seizure possibly from the Wellbutrin and excessive ETOH the day before [employer] Sunday.) Pt was told to stop Zoloft and Wellbutrin, he was weaned off both medications. Pt was told to follow up with Neurology for workup, he did not follow-up with this and has had no further work-up.  Anxiety & Depression Pt is currently not on any medication and is feeling great at this time. He is currently working from home and enjoying it. His girlfriend is staying with him. Pt seeing Psychiatrist Dr. Kallie Locks last visit was mid Feb and will follow up in 3 months. He is sleeping well. Pt denies  SI/HI.    ROS: See pertinent positives and negatives per HPI.  Patient Active Problem List   Diagnosis Date Noted  . Anxiety 03/28/2017  . Depression 03/28/2017    Social History   Tobacco Use  . Smoking status: Never Smoker  . Smokeless tobacco: Former Engineer, water Use Topics  . Alcohol use: Yes    Alcohol/week: 10.0 - 12.0 standard drinks    Types: 10 - 12 Cans of beer per week   No current outpatient medications on file.  No Known Allergies  Objective:   VITALS: Per patient if applicable, see vitals. GENERAL: Alert, appears well and in no acute distress. HEENT: Atraumatic, conjunctiva clear, no  obvious abnormalities on inspection of external nose and ears. NECK: Normal movements of the head and neck. CARDIOPULMONARY: No increased WOB. Speaking in clear sentences. I:E ratio WNL.  MS: Moves all visible extremities without noticeable abnormality. PSYCH: Pleasant and cooperative, well-groomed. Speech normal rate and rhythm. Affect is appropriate. Insight and judgement are appropriate. Attention is focused, linear, and appropriate.  NEURO: CN grossly intact. Oriented as arrived to appointment on time with no prompting. Moves both UE equally.  SKIN: No obvious lesions, wounds, erythema, or cyanosis noted on face or hands.  Assessment and Plan:   Jimin was seen today for follow-up, anxiety and depression.  Diagnoses and all orders for this visit:  Seizure [facility]) One time event. We are going to defer neurology referral unless has another episode. Will add wellbutrin to allergy list. Avoid excessive ETOH.  Anxiety Well controlled. Mgmt per psych.   . Reviewed expectations re: course of current medical issues. . Discussed self-management of symptoms. . Outlined signs and symptoms indicating need for more acute intervention. . Patient verbalized understanding and all questions were answered. Marland Kitchen Health Maintenance issues including appropriate healthy diet, exercise, and smoking avoidance were discussed with patient. . See orders for this visit as documented in the electronic medical record.  I discussed the assessment and treatment plan with the patient. The patient was provided an opportunity to ask questions and all were answered. The patient agreed with the plan and demonstrated an understanding of the instructions.  The patient was advised to call back or seek an in-person evaluation if the symptoms worsen or if the condition fails to improve as anticipated.  CMA or LPN served as scribe during this visit. History, Physical, and Plan performed by medical provider. The above  documentation has been reviewed and is accurate and complete.    [place], [place] 12/28/2018

## 2019-08-06 ENCOUNTER — Encounter: Payer: Self-pay | Admitting: Physician Assistant

## 2019-08-06 ENCOUNTER — Ambulatory Visit (INDEPENDENT_AMBULATORY_CARE_PROVIDER_SITE_OTHER): Payer: BC Managed Care – PPO | Admitting: Physician Assistant

## 2019-08-06 VITALS — Temp 97.3°F | Ht 70.0 in | Wt 230.0 lb

## 2019-08-06 DIAGNOSIS — F419 Anxiety disorder, unspecified: Secondary | ICD-10-CM | POA: Diagnosis not present

## 2019-08-06 DIAGNOSIS — F321 Major depressive disorder, single episode, moderate: Secondary | ICD-10-CM | POA: Diagnosis not present

## 2019-08-06 DIAGNOSIS — L0100 Impetigo, unspecified: Secondary | ICD-10-CM | POA: Diagnosis not present

## 2019-08-06 MED ORDER — CEPHALEXIN 500 MG PO CAPS: 500.0000 mg | ORAL_CAPSULE | Freq: Three times a day (TID) | ORAL | 0 refills | Status: AC

## 2019-08-06 MED ORDER — MUPIROCIN 2 % EX OINT: TOPICAL_OINTMENT | CUTANEOUS | 0 refills | Status: DC

## 2019-08-06 NOTE — Progress Notes (Signed)
Virtual Visit via Video   I connected with Jordan Ryan on 08/06/19 at 11:40 AM EST by a video enabled telemedicine application and verified that I am speaking with the correct person using two identifiers. Location patient: Home Location provider: [facility], Office Persons participating in the virtual visit: Jordan Ryan, Jordan Ryan, Jordan Mull, Jordan Ryan   I discussed the limitations of evaluation and management by telemedicine and the availability of in person appointments. The patient expressed understanding and agreed to proceed.  I acted as a Jordan Ryan for Jordan Ryan, [employer], Jordan Ryan  Subjective:   HPI:   Rash Pt c/o rash on Rt cheek, Lt cheek, Lt nostril, corner left eye, Pubic area --  started this weekend. Has been using Mupirocin. Children have it also and our on antibiotics.  He reports that his kids were in fact diagnosed with impetigo, and his symptoms are consistent with the lesions that they have.  He denies any concerns for infected lesions in terms of purulent drainage, fever, chills, significant tenderness to palpation.  Anxiety and depression Feels like his mood is all overall stable but he is interested in seeing a therapist denies any suicidal or homicidal ideation.  Currently not on any medications.  ROS: See pertinent positives and negatives per HPI.  Patient Active Problem List   Diagnosis Date Noted  . Anxiety 03/28/2017  . Depression 03/28/2017    Social History   Tobacco Use  . Smoking status: Never Smoker  . Smokeless tobacco: Former Engineer, water Use Topics  . Alcohol use: Yes    Alcohol/week: 10.0 - 12.0 standard drinks    Types: 10 - 12 Cans of beer per week    Current Outpatient Medications:  .  cephALEXin (KEFLEX) 500 MG capsule, Take 1 capsule (500 mg total) by mouth 3 (three) times daily for 7 days., Disp: 21 capsule, Rfl: 0 .  mupirocin ointment (BACTROBAN) 2 %, Apply to affected area 1-2 times daily, Disp: 22 g,  Rfl: 0  Allergies  Allergen Reactions  . Wellbutrin [Bupropion] Other (See Comments)    Seizure    Objective:   VITALS: Per patient if applicable, see vitals. GENERAL: Alert, appears well and in no acute distress. HEENT: Atraumatic, conjunctiva clear, no obvious abnormalities on inspection of external nose and ears. NECK: Normal movements of the head and neck. CARDIOPULMONARY: No increased WOB. Speaking in clear sentences. I:E ratio WNL.  MS: Moves all visible extremities without noticeable abnormality. PSYCH: Pleasant and cooperative, well-groomed. Speech normal rate and rhythm. Affect is appropriate. Insight and judgement are appropriate. Attention is focused, linear, and appropriate.  NEURO: CN grossly intact. Oriented as arrived to appointment on time with no prompting. Moves both UE equally.  SKIN: Multiple areas of erythema on face with crusting  Assessment and Plan:   Jordan Ryan was seen today for rash.  Diagnoses and all orders for this visit:  Impetigo He has tried mupirocin without relief.  We are going to start Keflex for his symptoms so he can take this if she will provide more widespread relief.  He also would like a refill on mupirocin.  I have sent this in for him worsening precautions advised in the interim.  Anxiety; Depression, major, single episode, moderate (HCC) Declines any need for medication at this time but would like to see a therapist will reach out to my therapist in our office, Jordan Ryan to see if she can reach out to patient to get on her schedule. I  discussed with patient that if they develop any SI, to tell someone immediately and seek medical attention.   Other orders -     mupirocin ointment (BACTROBAN) 2 %; Apply to affected area 1-2 times daily -     cephALEXin (KEFLEX) 500 MG capsule; Take 1 capsule (500 mg total) by mouth 3 (three) times daily for 7 days.    . Reviewed expectations re: course of current medical issues. . Discussed  self-management of symptoms. . Outlined signs and symptoms indicating need for more acute intervention. . Patient verbalized understanding and all questions were answered. Marland Kitchen Health Maintenance issues including appropriate healthy diet, exercise, and smoking avoidance were discussed with patient. . See orders for this visit as documented in the electronic medical record.  I discussed the assessment and treatment plan with the patient. The patient was provided an opportunity to ask questions and all were answered. The patient agreed with the plan and demonstrated an understanding of the instructions.   The patient was advised to call back or seek an in-person evaluation if the symptoms worsen or if the condition fails to improve as anticipated.   CMA or Jordan Ryan served as scribe during this visit. History, Physical, and Plan performed by medical provider. The above documentation has been reviewed and is accurate and complete.   Jordan Ryan, [place] 08/06/2019

## 2019-10-01 ENCOUNTER — Other Ambulatory Visit: Payer: Self-pay

## 2019-10-01 ENCOUNTER — Ambulatory Visit (INDEPENDENT_AMBULATORY_CARE_PROVIDER_SITE_OTHER): Payer: BC Managed Care – PPO | Admitting: Adult Health

## 2019-10-01 ENCOUNTER — Encounter: Payer: Self-pay | Admitting: Adult Health

## 2019-10-01 DIAGNOSIS — F410 Panic disorder [episodic paroxysmal anxiety] without agoraphobia: Secondary | ICD-10-CM

## 2019-10-01 DIAGNOSIS — F1010 Alcohol abuse, uncomplicated: Secondary | ICD-10-CM | POA: Diagnosis not present

## 2019-10-01 DIAGNOSIS — F331 Major depressive disorder, recurrent, moderate: Secondary | ICD-10-CM | POA: Diagnosis not present

## 2019-10-01 DIAGNOSIS — F411 Generalized anxiety disorder: Secondary | ICD-10-CM | POA: Diagnosis not present

## 2019-10-01 MED ORDER — TOPIRAMATE 25 MG PO TABS: ORAL_TABLET | ORAL | 2 refills | Status: DC

## 2019-10-01 NOTE — Progress Notes (Signed)
Jordan Ryan [id number] 05-02-1981 39 y.o.  Subjective:   Patient ID:  Jordan Ryan is a 39 y.o. (DOB 16-Apr-1981) male.  Chief Complaint: No chief complaint on file.   HPI Jordan Ryan presents to the office today for follow-up of anxiety, depression, panic attacks, and alcohol abuse.   Describes mood today as "so-so". Pleasant. Mood symptoms - reports depression, anxiety, and irritability. More anxious overall. Concerned that he may be drinking "too much alcohol". Does not think he has a "problem" with it, but has found himself drinking more this past year. Stating "I think it's making my mood, anxiety and sleep worse". Wanting to try something to make him stop drinking, but not make him sick. Stable interest and motivation. Taking medications as prescribed.  Energy levels low. Active, does not have a regular exercise routine. Works full-time - Manufacturing systems engineer.  Enjoys some usual interests and activities. Divorced. Remarried on December 27th. Moved into a new home on December 8th. Has 3 children of his own - 39 year old twin boys and a 37 year old daughter and wife has 4 children - shared custody. Spending time with family. Appetite adequate. Weight gain - 235. Sleeps well most nights. Averages 6 to 8 hours. Focus and concentration stable. Completing tasks. Managing aspects of household. Work going well.  Denies SI or HI. Denies AH or VH.   Review of Systems:  Review of Systems  Musculoskeletal: Negative for gait problem.  Neurological: Negative for tremors.  Psychiatric/Behavioral:       Please refer to HPI    Medications: I have reviewed the patient's current medications.  Current Outpatient Medications  Medication Sig Dispense Refill  . mupirocin ointment (BACTROBAN) 2 % Apply to affected area 1-2 times daily 22 g 0  . topiramate (TOPAMAX) 25 MG tablet Take one tablet at bedtime for 7 days, then take two tablets at bedtime. 60 tablet 2   No current facility-administered medications  for this visit.    Medication Side Effects: None  Allergies:  Allergies  Allergen Reactions  . Wellbutrin [Bupropion] Other (See Comments)    Seizure    Past Medical History:  Diagnosis Date  . Anxiety   . Depression   . Seizure (HCC) 2020   thought to be from wellbutrin    Family History  Problem Relation Age of Onset  . Aneurysm Mother   . Depression Mother   . Depression Brother   . Diabetes Maternal Grandfather     Social History   Socioeconomic History  . Marital status: Divorced    Spouse name: Not on file  . Number of children: Not on file  . Years of education: Not on file  . Highest education level: Not on file  Occupational History  . Not on file  Tobacco Use  . Smoking status: Never Smoker  . Smokeless tobacco: Former Engineer, water and Sexual Activity  . Alcohol use: Yes    Alcohol/week: 10.0 - 12.0 standard drinks    Types: 10 - 12 Cans of beer per week  . Drug use: No  . Sexual activity: Yes  Other Topics Concern  . Not on file  Social History Narrative   Advertising account planner   From [place]    Going through a divorce   Twin 7 year olds and 37 year old girls   Fun: play guitar, hiking, distractions   Social Determinants of Health   Financial Resource Strain:   . Difficulty of Paying Living Expenses: Not on file  Food Insecurity:   . Worried About Charity fundraiser in the Last Year: Not on file  . Ran Out of Food in the Last Year: Not on file  Transportation Needs:   . Lack of Transportation (Medical): Not on file  . Lack of Transportation (Non-Medical): Not on file  Physical Activity:   . Days of Exercise per Week: Not on file  . Minutes of Exercise per Session: Not on file  Stress:   . Feeling of Stress : Not on file  Social Connections:   . Frequency of Communication with Friends and Family: Not on file  . Frequency of Social Gatherings with Friends and Family: Not on file  . Attends Religious Services: Not on file  . Active  Member of Clubs or Organizations: Not on file  . Attends Archivist Meetings: Not on file  . Marital Status: Not on file  Intimate Partner Violence:   . Fear of Current or Ex-Partner: Not on file  . Emotionally Abused: Not on file  . Physically Abused: Not on file  . Sexually Abused: Not on file    Past Medical History, Surgical history, Social history, and Family history were reviewed and updated as appropriate.   Please see review of systems for further details on the patient's review from today.   Objective:   Physical Exam:  There were no vitals taken for this visit.  Physical Exam Psychiatric:        Mood and Affect: Mood is anxious and depressed.     Lab Review:     Component Value Date/Time   NA 135 10/30/2018 2031   K 3.4 (L) 10/30/2018 2031   CL 100 10/30/2018 2031   CO2 23 10/30/2018 2031   GLUCOSE 116 (H) 10/30/2018 2031   BUN 9 10/30/2018 2031   CREATININE 0.94 10/30/2018 2031   CALCIUM 9.2 10/30/2018 2031   PROT 7.7 10/30/2018 2046   ALBUMIN 4.5 10/30/2018 2046   AST 52 (H) 10/30/2018 2046   ALT 50 (H) 10/30/2018 2046   ALKPHOS 46 10/30/2018 2046   BILITOT 1.3 (H) 10/30/2018 2046   GFRNONAA >60 10/30/2018 2031   GFRAA >60 10/30/2018 2031       Component Value Date/Time   WBC 9.0 10/30/2018 2031   RBC 4.73 10/30/2018 2031   HGB 15.2 10/30/2018 2031   HCT 45.7 10/30/2018 2031   PLT 198 10/30/2018 2031   MCV 96.6 10/30/2018 2031   MCH 32.1 10/30/2018 2031   MCHC 33.3 10/30/2018 2031   RDW 12.1 10/30/2018 2031   LYMPHSABS 1.5 03/07/2017 1513   MONOABS 0.4 03/07/2017 1513   EOSABS 0.0 03/07/2017 1513   BASOSABS 0.0 03/07/2017 1513    No results found for: POCLITH, LITHIUM   No results found for: PHENYTOIN, PHENOBARB, VALPROATE, CBMZ   .res Assessment: Plan:    Plan:  1. Add Topamax 25mg  at hs x 7 days, then increase to two tablets at bedtime to help with ETOH cessation.  RTC 4 weeks  Patient advised to contact office with  any questions, adverse effects, or acute worsening in signs and symptoms.  Diagnoses and all orders for this visit:  Major depressive disorder, recurrent episode, moderate (HCC)  Generalized anxiety disorder  Alcohol abuse  Panic attacks  Other orders -     topiramate (TOPAMAX) 25 MG tablet; Take one tablet at bedtime for 7 days, then take two tablets at bedtime.     Please see After Visit Summary for patient specific  instructions.  No future appointments.  No orders of the defined types were placed in this encounter.   -------------------------------

## 2019-12-22 ENCOUNTER — Other Ambulatory Visit: Payer: Self-pay | Admitting: Adult Health

## 2020-10-07 ENCOUNTER — Other Ambulatory Visit: Payer: Self-pay

## 2020-10-07 ENCOUNTER — Inpatient Hospital Stay (HOSPITAL_COMMUNITY)
Admission: EM | Admit: 2020-10-07 | Discharge: 2020-10-09 | DRG: 101 | Disposition: A | Discharge status: Home / Self Care | Payer: No Typology Code available for payment source | Attending: Internal Medicine | Admitting: Internal Medicine

## 2020-10-07 ENCOUNTER — Emergency Department (HOSPITAL_COMMUNITY): Payer: No Typology Code available for payment source

## 2020-10-07 DIAGNOSIS — Z818 Family history of other mental and behavioral disorders: Secondary | ICD-10-CM

## 2020-10-07 DIAGNOSIS — Z79899 Other long term (current) drug therapy: Secondary | ICD-10-CM

## 2020-10-07 DIAGNOSIS — Z833 Family history of diabetes mellitus: Secondary | ICD-10-CM

## 2020-10-07 DIAGNOSIS — Z20822 Contact with and (suspected) exposure to covid-19: Secondary | ICD-10-CM | POA: Diagnosis present

## 2020-10-07 DIAGNOSIS — G40909 Epilepsy, unspecified, not intractable, without status epilepticus: Principal | ICD-10-CM | POA: Diagnosis present

## 2020-10-07 DIAGNOSIS — Z888 Allergy status to other drugs, medicaments and biological substances status: Secondary | ICD-10-CM

## 2020-10-07 DIAGNOSIS — Z72820 Sleep deprivation: Secondary | ICD-10-CM

## 2020-10-07 DIAGNOSIS — M6282 Rhabdomyolysis: Secondary | ICD-10-CM | POA: Diagnosis present

## 2020-10-07 DIAGNOSIS — N179 Acute kidney failure, unspecified: Secondary | ICD-10-CM | POA: Diagnosis present

## 2020-10-07 DIAGNOSIS — Z87891 Personal history of nicotine dependence: Secondary | ICD-10-CM

## 2020-10-07 DIAGNOSIS — R569 Unspecified convulsions: Secondary | ICD-10-CM

## 2020-10-07 DIAGNOSIS — F419 Anxiety disorder, unspecified: Secondary | ICD-10-CM | POA: Diagnosis present

## 2020-10-07 DIAGNOSIS — F32A Depression, unspecified: Secondary | ICD-10-CM | POA: Diagnosis present

## 2020-10-07 DIAGNOSIS — G930 Cerebral cysts: Secondary | ICD-10-CM | POA: Diagnosis present

## 2020-10-07 LAB — BASIC METABOLIC PANEL
Anion gap: 16 — ABNORMAL HIGH (ref 5–15)
BUN: 8 mg/dL (ref 6–20)
CO2: 21 mmol/L — ABNORMAL LOW (ref 22–32)
Calcium: 9.4 mg/dL (ref 8.9–10.3)
Chloride: 100 mmol/L (ref 98–111)
Creatinine, Ser: 1.49 mg/dL — ABNORMAL HIGH (ref 0.61–1.24)
GFR, Estimated: >60 mL/min (ref >60)
Glucose, Bld: 82 mg/dL (ref 70–99)
Potassium: 4.1 mmol/L (ref 3.5–5.1)
Sodium: 137 mmol/L (ref 135–145)

## 2020-10-07 IMAGING — CT CT HEAD W/O CM
4 series · 17 of 47 positions shown, 19 images · non-contrast
Comparison: Head CT [DATE]

CLINICAL DATA: Nontraumatic seizure today

EXAM:
CT HEAD WITHOUT CONTRAST
TECHNIQUE: Contiguous axial images were obtained from the base of the skull
through the vertex without intravenous contrast.

[Series 3: head without · axial · non-contrast · 0.45mm/px · z∈[-92,+38]mm · 7 of 36 slices shown, 9 images]
[im 5/36  brain]
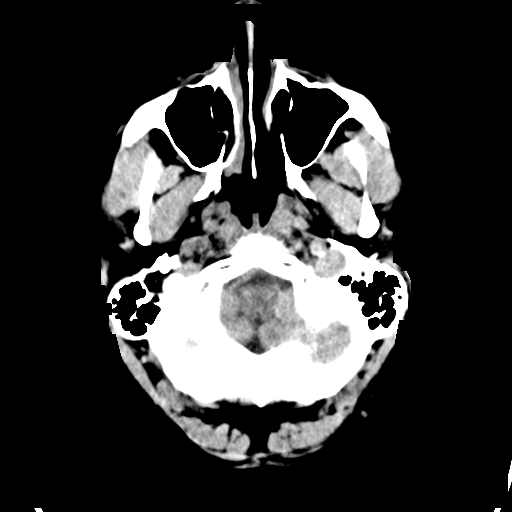
[im 5/36  bone]
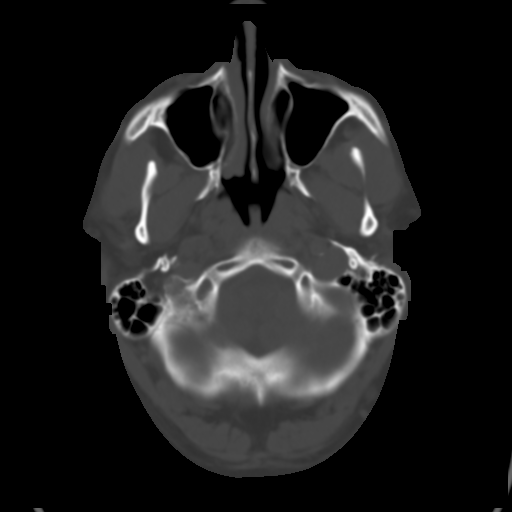
[im 9/36  brain]
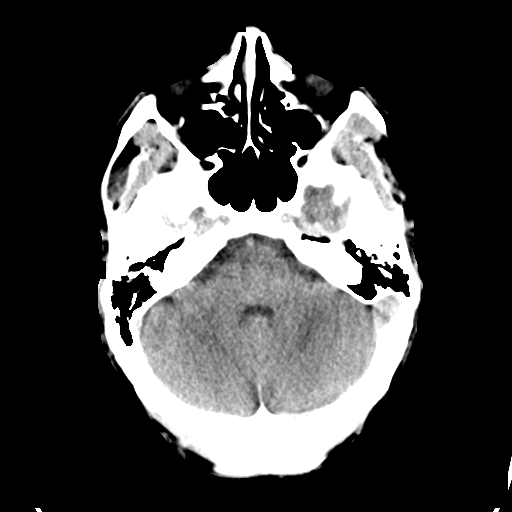
[im 14/36  brain]
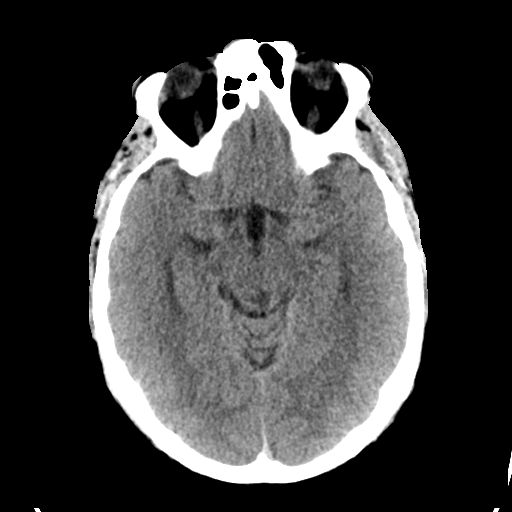
[im 18/36  brain]
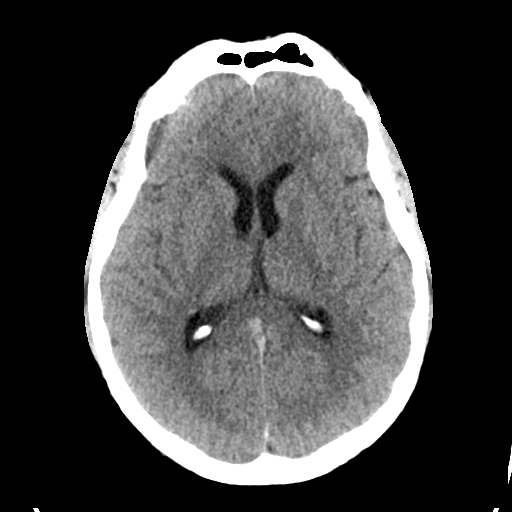
[im 22/36  brain]
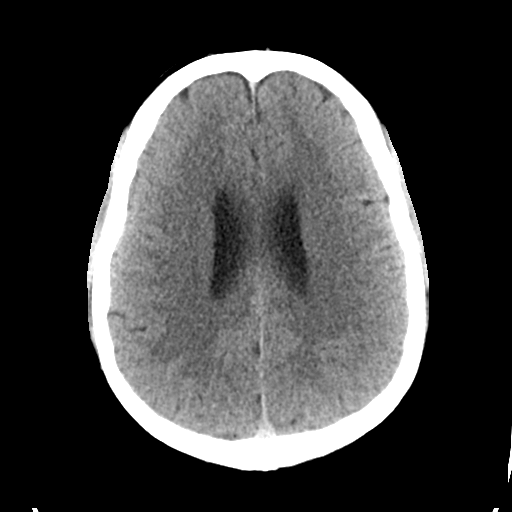
[im 22/36  bone]
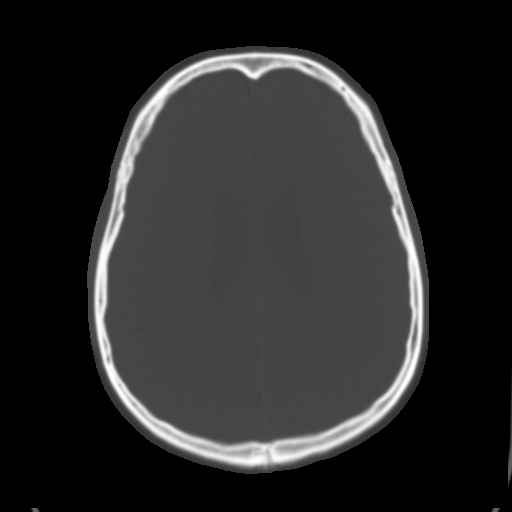
[im 27/36  brain]
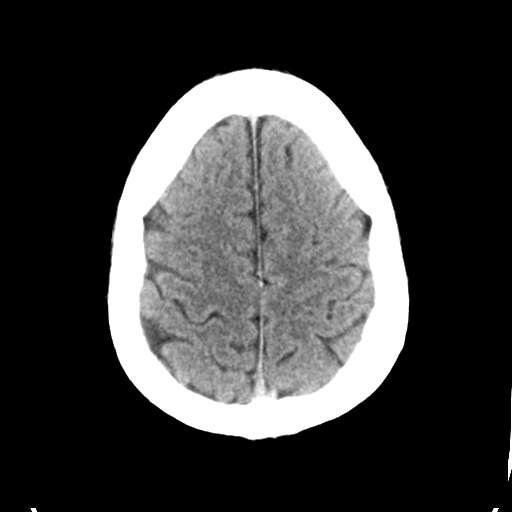
[im 31/36  brain]
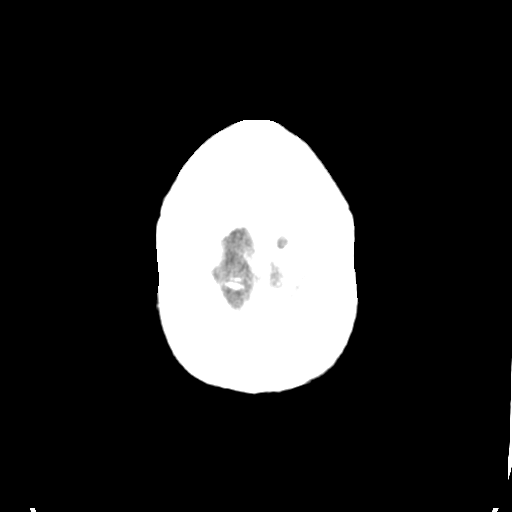

[Series 4: head bone · axial · 0.45mm/px · z∈[-96,-34]mm · 4 of 89 slices shown]
[im 9/89  bone]
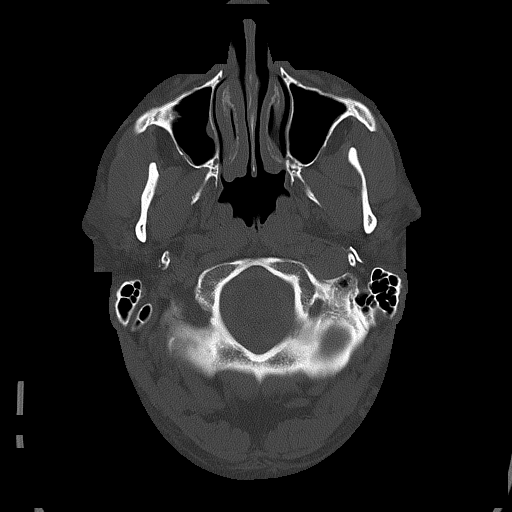
[im 18/89  bone]
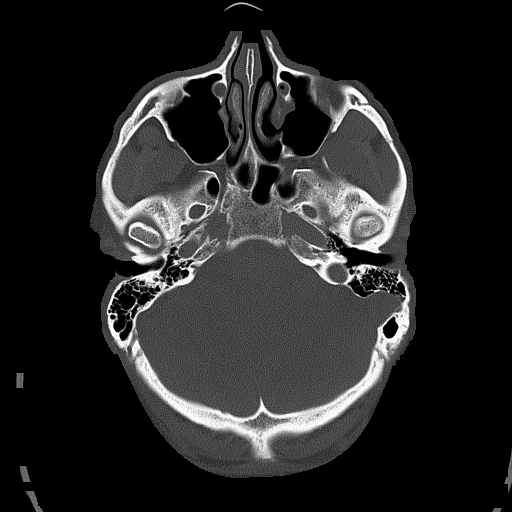
[im 27/89  bone]
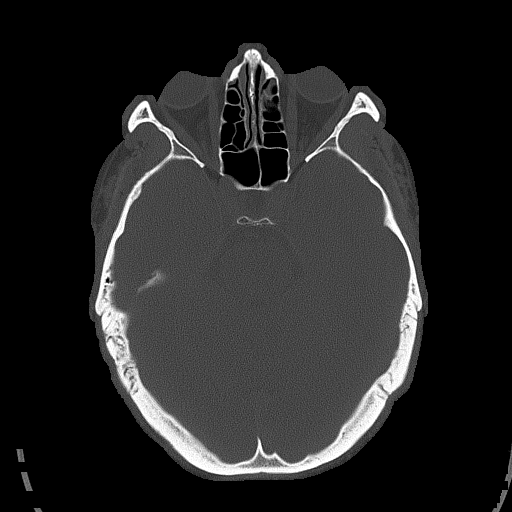
[im 40/89  bone]
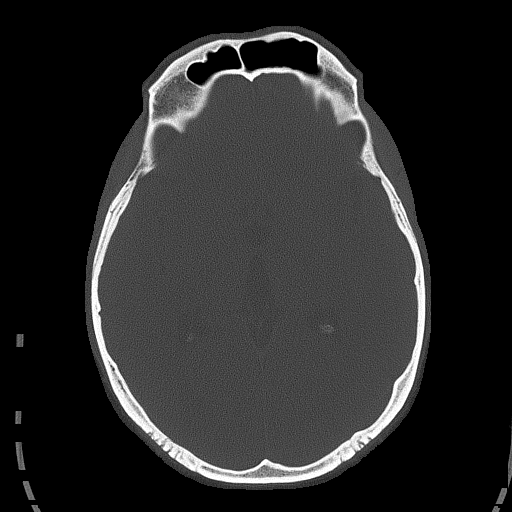

[Series 5: head without cor · coronal · non-contrast · 0.34mm/px · 3 of 74 slices shown]
[im 25/74  brain]
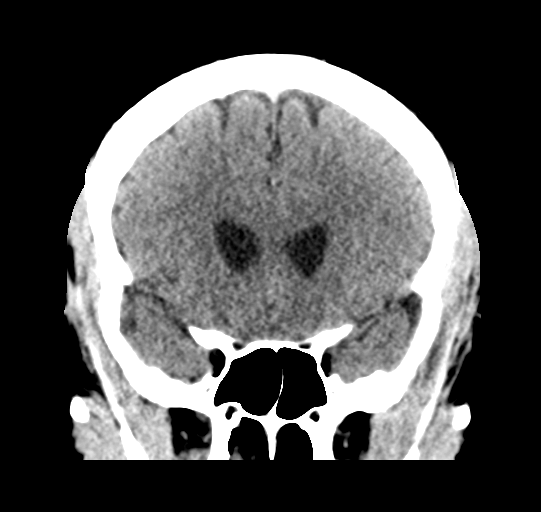
[im 33/74  brain]
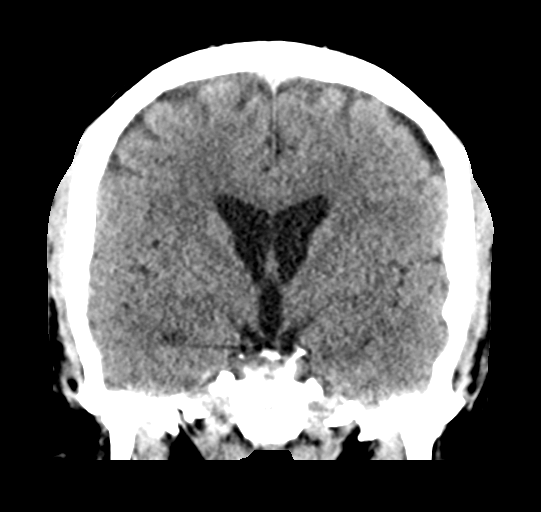
[im 41/74  brain]
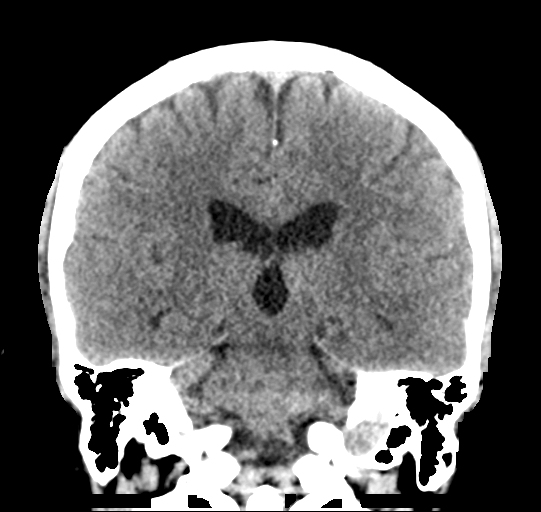

[Series 6: head without sag · sagittal · non-contrast · 0.34mm/px · 3 of 63 slices shown]
[im 21/63  brain]
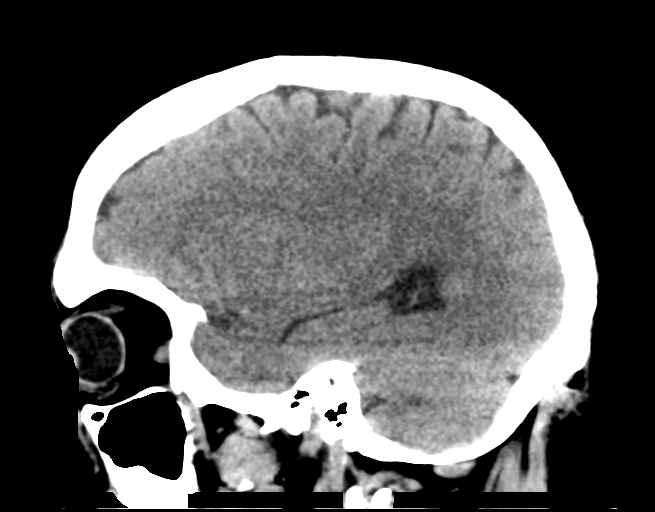
[im 32/63  brain]
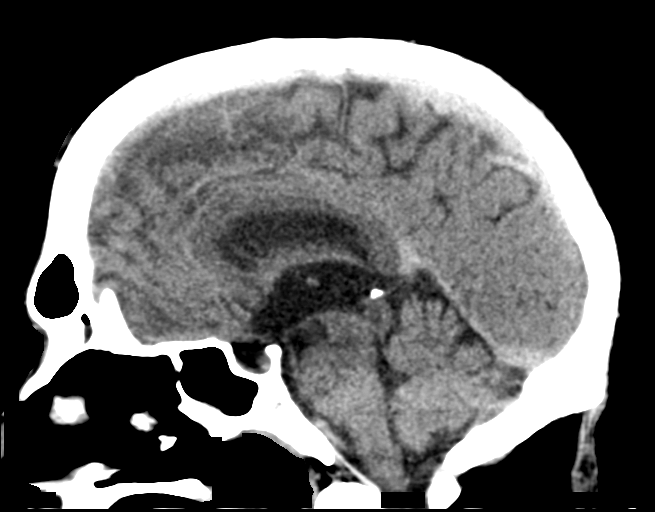
[im 42/63  brain]
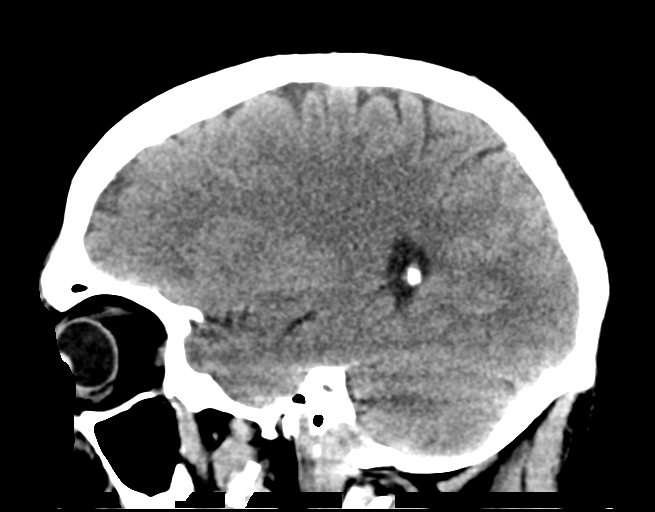

[17 of 47 positions shown; findings below may reference images not displayed]

FINDINGS: Brain: No evidence of acute infarction, hemorrhage, hydrocephalus,
extra-axial collection or mass lesion/mass effect.

Vascular: No hyperdense vessel or unexpected calcification.

Skull: Normal. Negative for fracture or focal lesion.

Sinuses/Orbits: Scattered mucosal thickening of the paranasal
sinuses. The mastoid air cells are predominantly clear.

Other: None.
IMPRESSION: No acute intracranial abnormality.

## 2020-10-07 NOTE — ED Triage Notes (Signed)
Pt had witnessed two minute full body seizure activity just PTA. Pt post-ictal on EMS arrival. 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 They lowered him to the ground, no apparent injury from falling. Reports one seizure two years ago, but does not take medication as the cause was never determined. Pt c/o generalized pain and tongue pain.

## 2020-10-08 ENCOUNTER — Encounter (HOSPITAL_COMMUNITY): Payer: Self-pay | Admitting: Internal Medicine

## 2020-10-08 ENCOUNTER — Observation Stay (HOSPITAL_COMMUNITY): Payer: No Typology Code available for payment source

## 2020-10-08 DIAGNOSIS — F419 Anxiety disorder, unspecified: Secondary | ICD-10-CM | POA: Diagnosis present

## 2020-10-08 DIAGNOSIS — Z72820 Sleep deprivation: Secondary | ICD-10-CM | POA: Diagnosis not present

## 2020-10-08 DIAGNOSIS — Z20822 Contact with and (suspected) exposure to covid-19: Secondary | ICD-10-CM | POA: Diagnosis present

## 2020-10-08 DIAGNOSIS — Z818 Family history of other mental and behavioral disorders: Secondary | ICD-10-CM | POA: Diagnosis not present

## 2020-10-08 DIAGNOSIS — M6282 Rhabdomyolysis: Secondary | ICD-10-CM | POA: Diagnosis present

## 2020-10-08 DIAGNOSIS — G930 Cerebral cysts: Secondary | ICD-10-CM | POA: Diagnosis present

## 2020-10-08 DIAGNOSIS — N179 Acute kidney failure, unspecified: Secondary | ICD-10-CM | POA: Diagnosis present

## 2020-10-08 DIAGNOSIS — R569 Unspecified convulsions: Secondary | ICD-10-CM

## 2020-10-08 DIAGNOSIS — Z87891 Personal history of nicotine dependence: Secondary | ICD-10-CM | POA: Diagnosis not present

## 2020-10-08 DIAGNOSIS — F32A Depression, unspecified: Secondary | ICD-10-CM | POA: Diagnosis present

## 2020-10-08 DIAGNOSIS — Z79899 Other long term (current) drug therapy: Secondary | ICD-10-CM | POA: Diagnosis not present

## 2020-10-08 DIAGNOSIS — Z833 Family history of diabetes mellitus: Secondary | ICD-10-CM | POA: Diagnosis not present

## 2020-10-08 DIAGNOSIS — G40909 Epilepsy, unspecified, not intractable, without status epilepticus: Secondary | ICD-10-CM | POA: Diagnosis present

## 2020-10-08 DIAGNOSIS — Z888 Allergy status to other drugs, medicaments and biological substances status: Secondary | ICD-10-CM | POA: Diagnosis not present

## 2020-10-08 LAB — URINALYSIS, ROUTINE W REFLEX MICROSCOPIC
Bilirubin Urine: NEGATIVE
Glucose, UA: NEGATIVE mg/dL
Ketones, ur: NEGATIVE mg/dL
Leukocytes,Ua: NEGATIVE
Nitrite: NEGATIVE
Protein, ur: 100 mg/dL — AB
Specific Gravity, Urine: 1.017 (ref 1.005–1.030)
pH: 6 (ref 5.0–8.0)

## 2020-10-08 LAB — CK
Total CK: 2870 U/L — ABNORMAL HIGH (ref 49–397)
Total CK: 16433 U/L — ABNORMAL HIGH (ref 49–397)

## 2020-10-08 LAB — CBC
HCT: 43.8 % (ref 39.0–52.0)
Hemoglobin: 14.5 g/dL (ref 13.0–17.0)
MCH: 31.3 pg (ref 26.0–34.0)
MCHC: 33.1 g/dL (ref 30.0–36.0)
MCV: 94.6 fL (ref 80.0–100.0)
Platelets: 213 10*3/uL (ref 150–400)
RBC: 4.63 MIL/uL (ref 4.22–5.81)
RDW: 12.2 % (ref 11.5–15.5)
WBC: 7.4 10*3/uL (ref 4.0–10.5)
nRBC: 0 % (ref 0.0–0.2)

## 2020-10-08 LAB — RESP PANEL BY RT-PCR (FLU A&B, COVID) ARPGX2
Influenza A by PCR: NEGATIVE
Influenza B by PCR: NEGATIVE
SARS Coronavirus 2 by RT PCR: NEGATIVE

## 2020-10-08 LAB — BASIC METABOLIC PANEL
Anion gap: 11 (ref 5–15)
BUN: 7 mg/dL (ref 6–20)
CO2: 24 mmol/L (ref 22–32)
Calcium: 8.9 mg/dL (ref 8.9–10.3)
Chloride: 102 mmol/L (ref 98–111)
Creatinine, Ser: 1.01 mg/dL (ref 0.61–1.24)
GFR, Estimated: >60 mL/min (ref >60)
Glucose, Bld: 100 mg/dL — ABNORMAL HIGH (ref 70–99)
Potassium: 3.8 mmol/L (ref 3.5–5.1)
Sodium: 137 mmol/L (ref 135–145)

## 2020-10-08 LAB — HEPATIC FUNCTION PANEL
ALT: 56 U/L — ABNORMAL HIGH (ref 0–44)
AST: 153 U/L — ABNORMAL HIGH (ref 15–41)
Albumin: 3.1 g/dL — ABNORMAL LOW (ref 3.5–5.0)
Alkaline Phosphatase: 52 U/L (ref 38–126)
Bilirubin, Direct: 0.1 mg/dL (ref 0.0–0.2)
Indirect Bilirubin: 0.4 mg/dL (ref 0.3–0.9)
Total Bilirubin: 0.5 mg/dL (ref 0.3–1.2)
Total Protein: 5.9 g/dL — ABNORMAL LOW (ref 6.5–8.1)

## 2020-10-08 LAB — RAPID URINE DRUG SCREEN, HOSP PERFORMED
Amphetamines: NOT DETECTED
Barbiturates: NOT DETECTED
Benzodiazepines: NOT DETECTED
Cocaine: NOT DETECTED
Opiates: NOT DETECTED
Tetrahydrocannabinol: NOT DETECTED

## 2020-10-08 LAB — TSH: TSH: 2.668 u[IU]/mL (ref 0.350–4.500)

## 2020-10-08 LAB — HIV ANTIBODY (ROUTINE TESTING W REFLEX): HIV Screen 4th Generation wRfx: NONREACTIVE

## 2020-10-08 LAB — LIPASE, BLOOD: Lipase: 30 U/L (ref 11–51)

## 2020-10-08 LAB — ETHANOL: Alcohol, Ethyl (B): <10 mg/dL (ref <10)

## 2020-10-08 LAB — MAGNESIUM: Magnesium: 1.8 mg/dL (ref 1.7–2.4)

## 2020-10-08 IMAGING — MR MR HEAD WO/W CM
7 of 14 series · 25 of 48 positions shown · IV contrast (gadavist)
Comparison: Head CT [DATE] and earlier.

CLINICAL DATA: 39-year-old male with history of 1 prior seizure
about 2 years ago, new seizure at work on the day of presentation.

EXAM:
MRI HEAD WITHOUT AND WITH CONTRAST
TECHNIQUE: Multiplanar, multiecho pulse sequences of the brain and surrounding
structures were obtained without and with intravenous contrast.
CONTRAST:  10mL GADAVIST GADOBUTROL 1 MMOL/ML IV SOLN

[Series 2: DWI · axial · 3.0mm · 0.94mm/px · z∈[-72,+74]mm · 7 of 100 slices shown (1 of 2)]
[im 1/100]
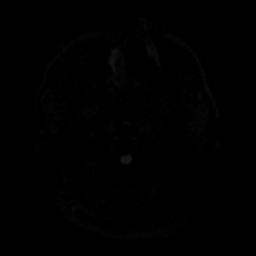
[im 17/100]
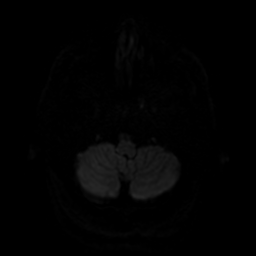
[im 34/100]
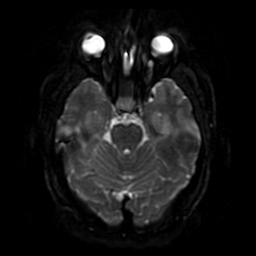
[im 50/100]
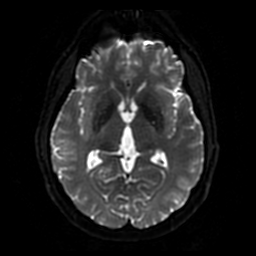
[im 67/100]
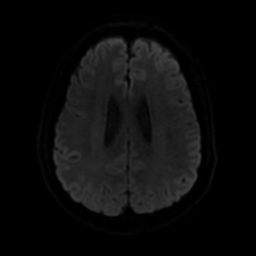
[im 83/100]
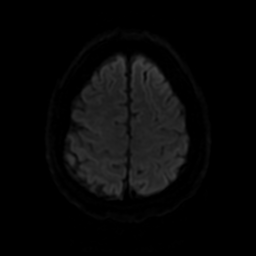
[im 100/100]
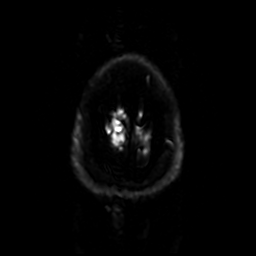

[Series 3: DWI · coronal · 4.0mm · 0.94mm/px · 5 of 74 slices shown (2 of 2)]
[im 1/74]
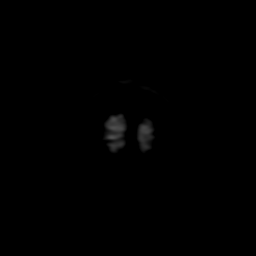
[im 19/74]
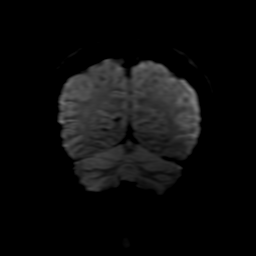
[im 37/74]
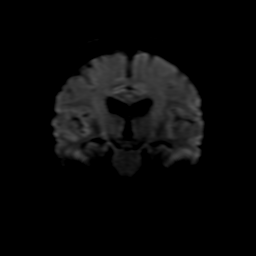
[im 55/74]
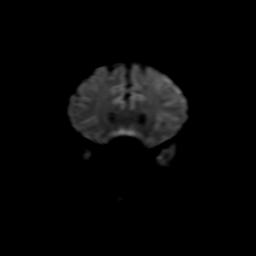
[im 74/74]
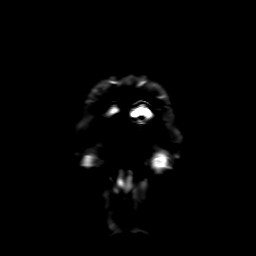

[Series 4: FLAIR · sagittal · 5.0mm · 0.23mm/px · 2 of 25 slices shown (1 of 3)]
[im 1/25]
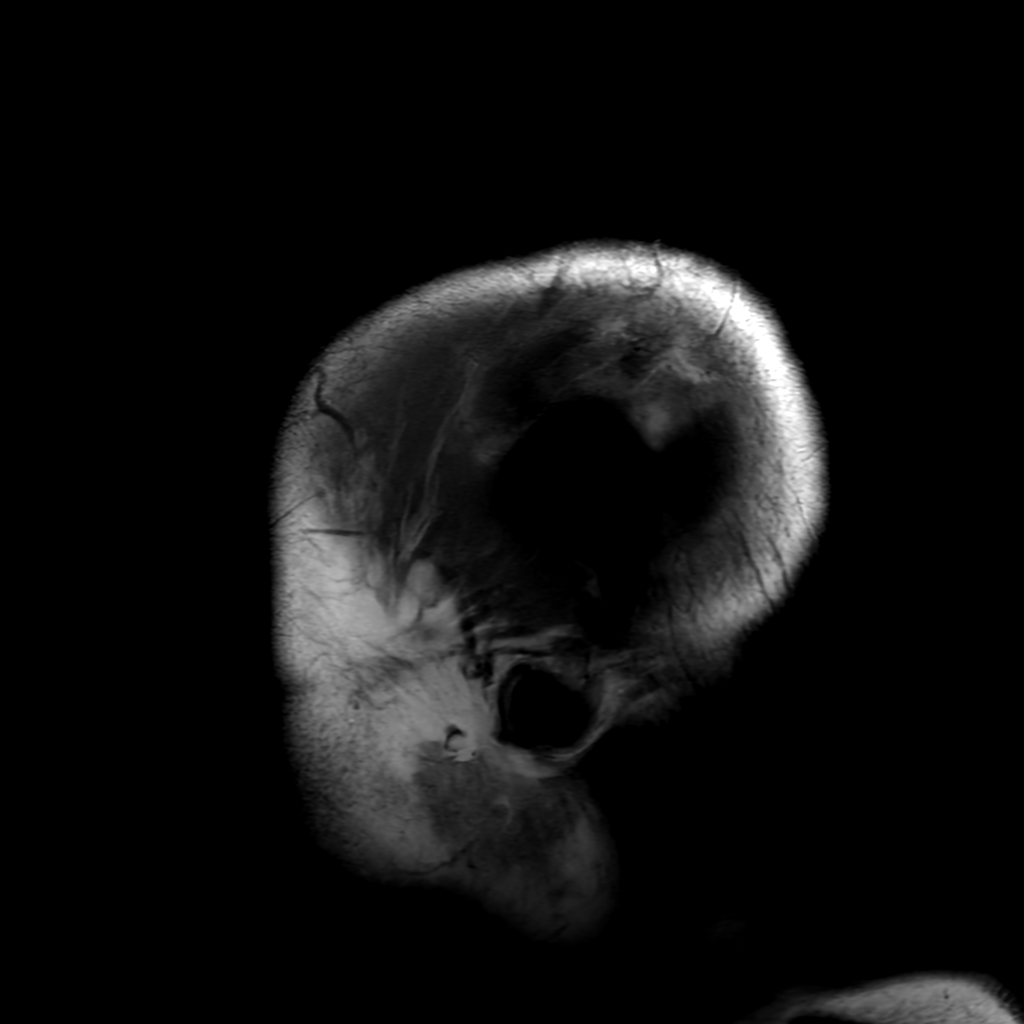
[im 25/25]
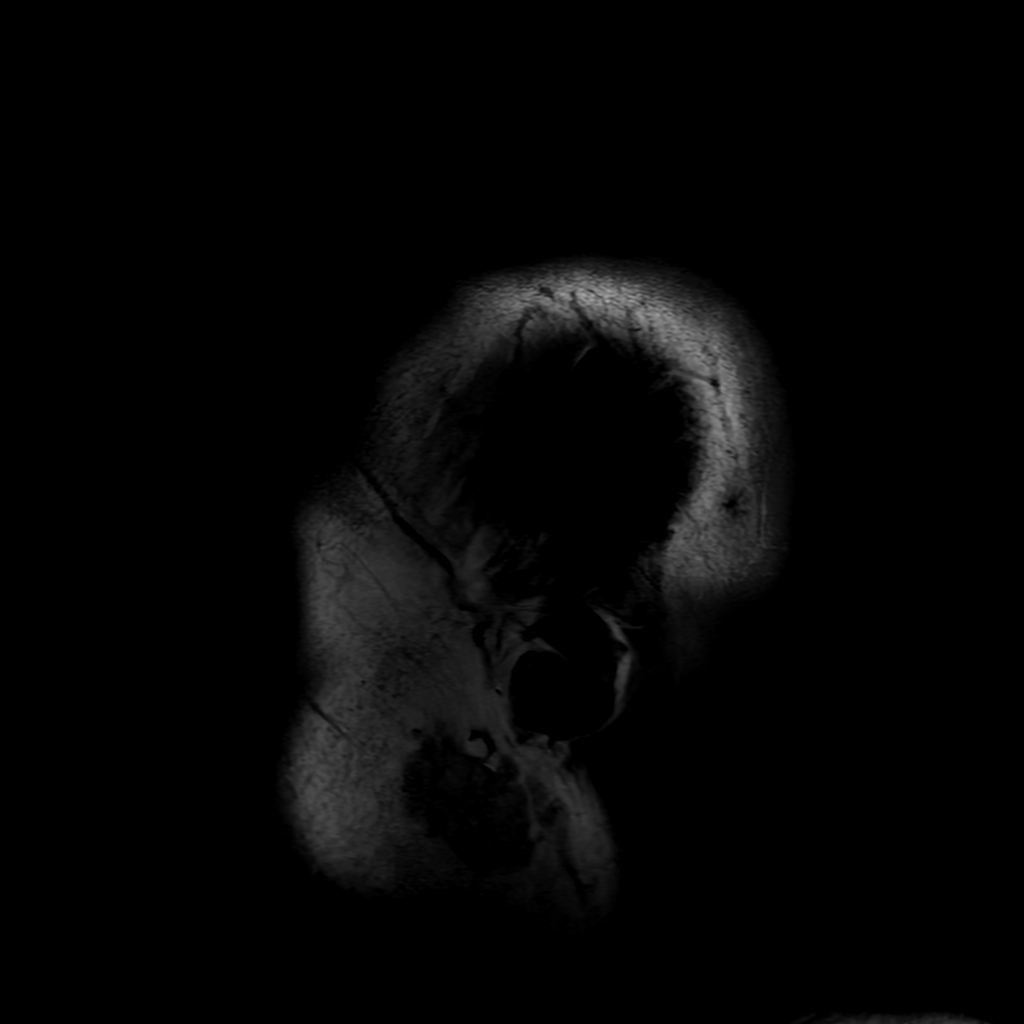

[Series 6: FLAIR · axial · 3.0mm · 0.45mm/px · z∈[-68,+76]mm · 2 of 25 slices shown (2 of 3)]
[im 1/25]
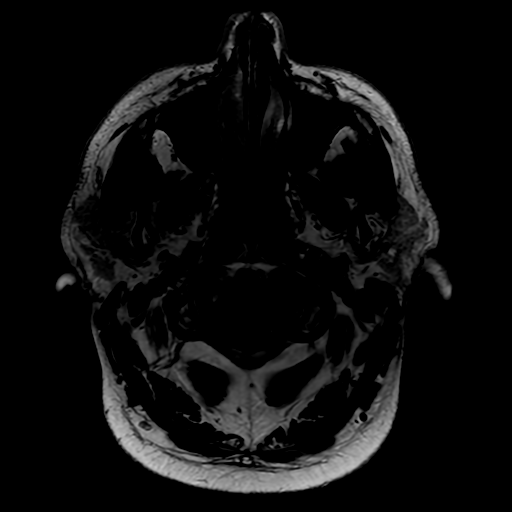
[im 25/25]
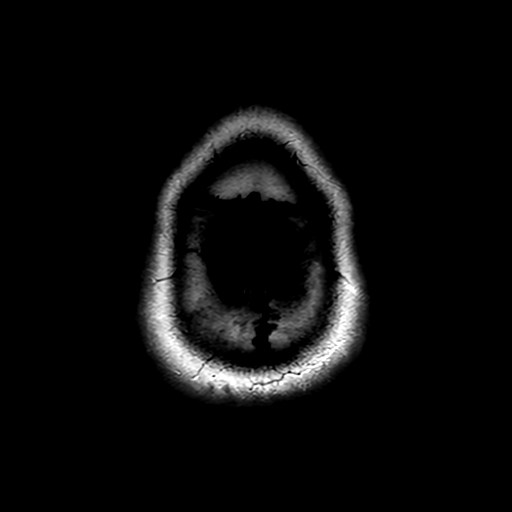

[Series 10: FLAIR · coronal · 3.0mm · 0.43mm/px · 2 of 26 slices shown (3 of 3)]
[im 1/26]
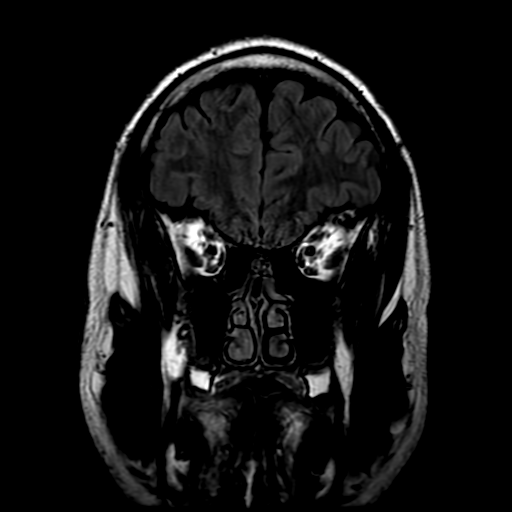
[im 26/26]
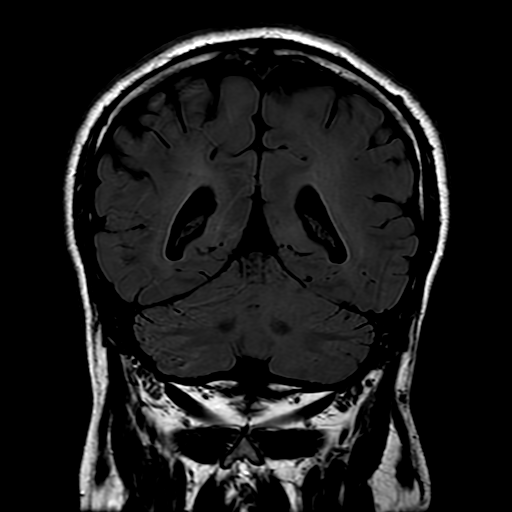

[Series 250: ADC · axial · 3.0mm · 0.94mm/px · z∈[-72,+74]mm · 4 of 50 slices shown (1 of 2)]
[im 1/50]
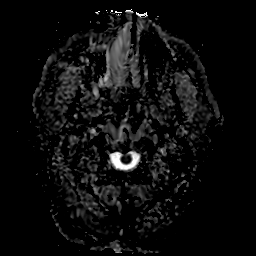
[im 17/50]
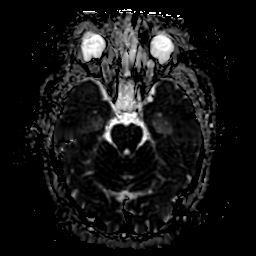
[im 33/50]
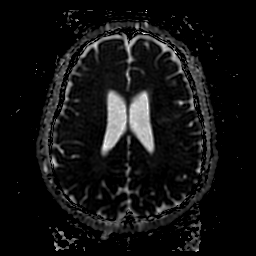
[im 50/50]
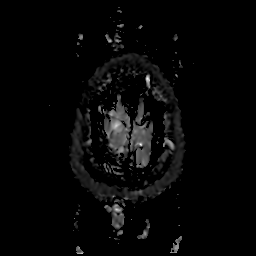

[Series 350: ADC · coronal · 4.0mm · 0.94mm/px · 3 of 37 slices shown (2 of 2)]
[im 1/37]
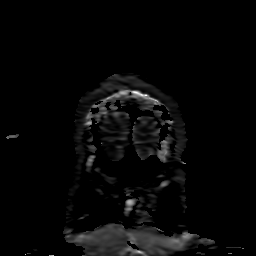
[im 19/37]
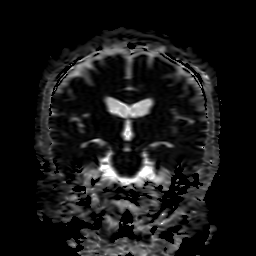
[im 37/37]
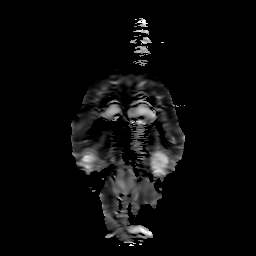

[25 of 48 positions shown; findings below may reference images not displayed]

FINDINGS: Brain: Cerebral volume appears stable since [KE] and within normal
limits. No restricted diffusion to suggest acute infarction. No
midline shift, mass effect, extra-axial collection or acute
intracranial hemorrhage. Cervicomedullary junction and pituitary are
within normal limits.

There is no significant ventriculomegaly, no enlargement of the
temporal horns and lateral and 3rd ventricle size appears stable
since [KE]. However, there is a small T2 hyperintense, somewhat
cystic appearing lesion at the junction of the 3rd ventricle and
cerebral aqueduct (series 6, image 11) which is nonenhancing but
demonstrates either chronic blood products or faint calcification
(series 7, image 43). The lesion is separate from the pineal gland.
And there is mild associated mass effect, parenchymal FLAIR
hyperintensity at the dorsal midbrain, colliculus. The area is
facilitated on diffusion. The lesion is about 6 mm diameter.

No similar intraventricular or extra-axial lesion is identified
elsewhere. However, there is also a small area of chronic
microhemorrhage suggested in the anterior right temporal lobe on
series 7, image 38. No abnormal intracranial enhancement or dural
thickening identified. No cortical encephalomalacia.

Thin slice coronal images redemonstrate the dorsal midbrain lesion
(series 10, image 6), while hippocampal formations appear symmetric
and within normal limits.

No migrational abnormality or heterotopia is identified.

Vascular: Major intracranial vascular flow voids are preserved. The
major dural venous sinuses are enhancing and appear to be patent.
The left transverse and sigmoid sinuses appear dominant.

Skull and upper cervical spine: Negative visible cervical spine,
bone marrow signal.

Sinuses/Orbits: Negative orbits. Trace paranasal sinus mucosal
thickening.

Other: Mastoids are clear. Visible internal auditory structures
appear normal. Visible scalp and face soft tissues appear negative.
IMPRESSION: 1. Positive for a small 6 mm cystic appearing and non-enhancing mass
which seems to be intraventricular at the junction of the 3rd
ventricle and aqueduct of Sylvius.
Associated faint calcification or chronic blood products with the
mass, which does demonstrate faint surrounding midbrain FLAIR
hyperintensity, although no convincing obstructive hydrocephalus at
this time.
Recommend Neurosurgery consultation with broad differential
considerations including inclusion cysts (such as an atypical
colloid, midline dermoid), post infectious etiology (cysticercosis),
and less likely neoplastic (pilocytic astrocytoma).

2. Chronic microhemorrhage or parenchymal calcification also
suggested in the anterior right temporal lobe, but otherwise the MRI
appearance of the brain is within normal limits.

## 2020-10-08 MED ORDER — ONDANSETRON HCL 4 MG PO TABS: 4.0000 mg | ORAL_TABLET | Freq: Four times a day (QID) | ORAL | Status: DC | PRN

## 2020-10-08 MED ORDER — SODIUM CHLORIDE 0.9 % IV BOLUS
1000.0000 mL | Freq: Once | INTRAVENOUS | Status: AC
  Administered 2020-10-08: 1000 mL via INTRAVENOUS

## 2020-10-08 MED ORDER — SERTRALINE HCL 100 MG PO TABS
100.0000 mg | ORAL_TABLET | Freq: Every day | ORAL | Status: DC
  Administered 2020-10-08 (×2): 100 mg via ORAL
  Filled 2020-10-08 (×3): qty 1

## 2020-10-08 MED ORDER — DIVALPROEX SODIUM 250 MG PO DR TAB
250.0000 mg | DELAYED_RELEASE_TABLET | Freq: Every day | ORAL | Status: DC
  Administered 2020-10-08 (×2): 250 mg via ORAL
  Filled 2020-10-08 (×2): qty 1

## 2020-10-08 MED ORDER — ACETAMINOPHEN 325 MG PO TABS: 650.0000 mg | ORAL_TABLET | Freq: Four times a day (QID) | ORAL | Status: DC | PRN

## 2020-10-08 MED ORDER — CALCIUM CARBONATE ANTACID 500 MG PO CHEW
2.0000 | CHEWABLE_TABLET | Freq: Four times a day (QID) | ORAL | Status: DC | PRN
  Administered 2020-10-08: 400 mg via ORAL
  Filled 2020-10-08: qty 2

## 2020-10-08 MED ORDER — HYDROXYZINE HCL 10 MG PO TABS
10.0000 mg | ORAL_TABLET | Freq: Every evening | ORAL | Status: DC | PRN
  Filled 2020-10-08: qty 1

## 2020-10-08 MED ORDER — PRAZOSIN HCL 1 MG PO CAPS
1.0000 mg | ORAL_CAPSULE | Freq: Every day | ORAL | Status: DC
  Administered 2020-10-08 (×2): 1 mg via ORAL
  Filled 2020-10-08 (×4): qty 1

## 2020-10-08 MED ORDER — SODIUM CHLORIDE 0.9 % IV BOLUS: 1000.0000 mL | Freq: Once | INTRAVENOUS | Status: DC

## 2020-10-08 MED ORDER — ACETAMINOPHEN 650 MG RE SUPP: 650.0000 mg | Freq: Four times a day (QID) | RECTAL | Status: DC | PRN

## 2020-10-08 MED ORDER — GADOBUTROL 1 MMOL/ML IV SOLN
10.0000 mL | Freq: Once | INTRAVENOUS | Status: AC | PRN
  Administered 2020-10-08: 10 mL via INTRAVENOUS

## 2020-10-08 MED ORDER — ONDANSETRON HCL 4 MG/2ML IJ SOLN: 4.0000 mg | Freq: Four times a day (QID) | INTRAMUSCULAR | Status: DC | PRN

## 2020-10-08 MED ORDER — HYDROXYZINE HCL 10 MG PO TABS
20.0000 mg | ORAL_TABLET | Freq: Every evening | ORAL | Status: DC | PRN
  Filled 2020-10-08: qty 2

## 2020-10-08 MED ORDER — LACTATED RINGERS IV SOLN: INTRAVENOUS | Status: DC

## 2020-10-08 MED ORDER — DIVALPROEX SODIUM 125 MG PO DR TAB
125.0000 mg | DELAYED_RELEASE_TABLET | Freq: Every day | ORAL | Status: DC
  Administered 2020-10-08 – 2020-10-09 (×2): 125 mg via ORAL
  Filled 2020-10-08 (×3): qty 1

## 2020-10-08 MED ORDER — SODIUM CHLORIDE 0.9 % IV SOLN: INTRAVENOUS | Status: DC

## 2020-10-08 NOTE — Consult Note (Signed)
Reason for Consult: Seizure intracranial cystic mass Referring Physician: [facility] hospitalist  Jordan MilroyColin Ryan is an 40 y.o. male.  HPI: 40 year old gentleman presented with seizure work-up revealed a cystic mass along the aqueduct. Patient had had a seizure 2 years ago unknown etiology they attributed it to potentially being on a medication that lowered his seizure threshold. No changes in diet medications drugs per patient report to know why he had the seizure that brought him in the hospital. Work-up however included an MRI scan of his brain which showed a small cystic mass in the aqueduct.  Past Medical History:  Diagnosis Date  . Anxiety   . Depression   . Seizure (HCC) 2020   thought to be from wellbutrin    Past Surgical History:  Procedure Laterality Date  . CYST REMOVAL NECK Left 2006   epidermal inclusion cyst    Family History  Problem Relation Age of Onset  . Aneurysm Mother   . Depression Mother   . Depression Brother   . Diabetes Maternal Grandfather     Social History:  reports that he has never smoked. He quit smokeless tobacco use about 9 years ago. He reports current alcohol use of about 10.0 - 12.0 standard drinks of alcohol per week. He reports that he does not use drugs.  Allergies:  Allergies  Allergen Reactions  . Wellbutrin [Bupropion] Other (See Comments)    Seizure    Medications: I have reviewed the patient's current medications.  Results for orders placed or performed during the hospital encounter of 10/07/20 (from the past 48 hour(s))  Basic metabolic panel - if new onset seizures     Status: Abnormal   Collection Time: 10/07/20  4:15 PM  Result Value Ref Range   Sodium 137 135 - 145 mmol/L   Potassium 4.1 3.5 - 5.1 mmol/L   Chloride 100 98 - 111 mmol/L   CO2 21 (L) 22 - 32 mmol/L   Glucose, Bld 82 70 - 99 mg/dL    Comment: Glucose reference range applies only to samples taken after fasting for at least 8 hours.   BUN 8 6 - 20 mg/dL    Creatinine, Ser 1.611.49 (H) 0.61 - 1.24 mg/dL   Calcium 9.4 8.9 - 09.610.3 mg/dL   GFR, Estimated >04>60 >54>60 mL/min    Comment: (NOTE) Calculated using the CKD-EPI Creatinine Equation (2021)    Anion gap 16 (H) 5 - 15    Comment: Performed at [facility], [address] [place], [place] [address]  [place]     Status: Abnormal   Collection Time: 10/07/20  4:42 PM  Result Value Ref Range   Total CK 2,870 (H) 49 - 397 U/L    Comment: Performed at [facility], [address] [place], [place] [address]  Resp Panel by RT-PCR (Flu A&B, Covid) Nasopharyngeal Swab     Status: None   Collection Time: 10/08/20 12:44 AM   Specimen: Nasopharyngeal Swab; Nasopharyngeal(NP) swabs in vial transport medium  Result Value Ref Range   SARS Coronavirus 2 by RT PCR NEGATIVE NEGATIVE    Comment: (NOTE) SARS-CoV-2 target nucleic acids are NOT DETECTED.  The SARS-CoV-2 RNA is generally detectable in upper respiratory specimens during the acute phase of infection. The lowest concentration of SARS-CoV-2 viral copies this assay can detect is 138 copies/mL. A negative result does not preclude SARS-Cov-2 infection and should not be used as the sole basis for treatment or other patient management decisions. A negative result  may occur with  improper specimen collection/handling, submission of specimen other than nasopharyngeal swab, presence of viral mutation(s) within the areas targeted by this assay, and inadequate number of viral copies(<138 copies/mL). A negative result must be combined with clinical observations, patient history, and epidemiological information. The expected result is Negative.  Fact Sheet for Patients:  BloggerCourse.com  Fact Sheet for Healthcare Providers:  SeriousBroker.it  This test is no t yet approved or cleared by the [place] FDA and  has been authorized for detection and/or diagnosis of SARS-CoV-2 by FDA under an  Emergency Use Authorization (EUA). This EUA will remain  in effect (meaning this test can be used) for the duration of the COVID-19 declaration under Section 564(b)(1) of the Act, 21 U.S.C.section 360bbb-3(b)(1), unless the authorization is terminated  or revoked sooner.       Influenza A by PCR NEGATIVE NEGATIVE   Influenza B by PCR NEGATIVE NEGATIVE    Comment: (NOTE) The Xpert Xpress SARS-CoV-2/FLU/RSV plus assay is intended as an aid in the diagnosis of influenza from Nasopharyngeal swab specimens and should not be used as a sole basis for treatment. Nasal washings and aspirates are unacceptable for Xpert Xpress SARS-CoV-2/FLU/RSV testing.  Fact Sheet for Patients: BloggerCourse.com  Fact Sheet for Healthcare Providers: SeriousBroker.it  This test is not yet approved or cleared by the [place] FDA and has been authorized for detection and/or diagnosis of SARS-CoV-2 by FDA under an Emergency Use Authorization (EUA). This EUA will remain in effect (meaning this test can be used) for the duration of the COVID-19 declaration under Section 564(b)(1) of the Act, 21 U.S.C. section 360bbb-3(b)(1), unless the authorization is terminated or revoked.  Performed at [facility], [address], [place], [place] [address]   Urine rapid drug screen (hosp performed)     Status: None   Collection Time: 10/08/20  1:02 AM  Result Value Ref Range   Opiates NONE DETECTED NONE DETECTED   Cocaine NONE DETECTED NONE DETECTED   Benzodiazepines NONE DETECTED NONE DETECTED   Amphetamines NONE DETECTED NONE DETECTED   Tetrahydrocannabinol NONE DETECTED NONE DETECTED   Barbiturates NONE DETECTED NONE DETECTED    Comment: (NOTE) DRUG SCREEN FOR MEDICAL PURPOSES ONLY.  IF CONFIRMATION IS NEEDED FOR ANY PURPOSE, NOTIFY LAB WITHIN 5 DAYS.  LOWEST DETECTABLE LIMITS FOR URINE DRUG SCREEN Drug Class                     Cutoff  (ng/mL) Amphetamine and metabolites    1000 Barbiturate and metabolites    200 Benzodiazepine                 200 Tricyclics and metabolites     300 Opiates and metabolites        300 Cocaine and metabolites        300 THC                            50 Performed at [facility], [address] [place], [place] [address]   Urinalysis, Routine w reflex microscopic     Status: Abnormal   Collection Time: 10/08/20  1:02 AM  Result Value Ref Range   Color, Urine YELLOW YELLOW   APPearance HAZY (A) CLEAR   Specific Gravity, Urine 1.017 1.005 - 1.030   pH 6.0 5.0 - 8.0   Glucose, UA NEGATIVE NEGATIVE mg/dL   Hgb urine dipstick MODERATE (A) NEGATIVE  Bilirubin Urine NEGATIVE NEGATIVE   Ketones, ur NEGATIVE NEGATIVE mg/dL   Protein, ur 672 (A) NEGATIVE mg/dL   Nitrite NEGATIVE NEGATIVE   Leukocytes,Ua NEGATIVE NEGATIVE   RBC / HPF 0-5 0 - 5 RBC/hpf   WBC, UA 0-5 0 - 5 WBC/hpf   Bacteria, UA RARE (A) NONE SEEN   Squamous Epithelial / LPF 0-5 0 - 5   Hyaline Casts, UA PRESENT    Sperm, UA PRESENT     Comment: Performed at [facility], [address] [place], [place] [address]  [place]     Status: Abnormal   Collection Time: 10/08/20  3:49 AM  Result Value Ref Range   Total CK 16,433 (H) 49 - 397 U/L    Comment: RESULTS CONFIRMED BY MANUAL DILUTION Performed at [facility], [address], [place], [place] [address]   Basic metabolic panel     Status: Abnormal   Collection Time: 10/08/20  3:49 AM  Result Value Ref Range   Sodium 137 135 - 145 mmol/L   Potassium 3.8 3.5 - 5.1 mmol/L   Chloride 102 98 - 111 mmol/L   CO2 24 22 - 32 mmol/L   Glucose, Bld 100 (H) 70 - 99 mg/dL    Comment: Glucose reference range applies only to samples taken after fasting for at least 8 hours.   BUN 7 6 - 20 mg/dL   Creatinine, Ser 6.62 0.61 - 1.24 mg/dL   Calcium 8.9 8.9 - 94.7 mg/dL   GFR, Estimated >65 >46 mL/min    Comment: (NOTE) Calculated using the CKD-EPI Creatinine  Equation (2021)    Anion gap 11 5 - 15    Comment: Performed at [facility], [address], [place], [place] [address]  Ethanol     Status: None   Collection Time: 10/08/20  3:49 AM  Result Value Ref Range   Alcohol, Ethyl (B) <10 <10 mg/dL    Comment: (NOTE) Lowest detectable limit for serum alcohol is 10 mg/dL.  For medical purposes only. Performed at [facility], [address] [place], [place] [address]   CBC     Status: None   Collection Time: 10/08/20  3:49 AM  Result Value Ref Range   WBC 7.4 4.0 - 10.5 K/uL   RBC 4.63 4.22 - 5.81 MIL/uL   Hemoglobin 14.5 13.0 - 17.0 g/dL   HCT 27.5 17.0 - 01.7 %   MCV 94.6 80.0 - 100.0 fL   MCH 31.3 26.0 - 34.0 pg   MCHC 33.1 30.0 - 36.0 g/dL   RDW 49.4 49.6 - 75.9 %   Platelets 213 150 - 400 K/uL   nRBC 0.0 0.0 - 0.2 %    Comment: Performed at [facility], [address] [place], [place] [address]  HIV Antibody (routine testing w rflx)     Status: None   Collection Time: 10/08/20  3:49 AM  Result Value Ref Range   HIV Screen 4th Generation wRfx Non Reactive Non Reactive    Comment: Performed at [facility], [address], [place], [place] [address]  Magnesium     Status: None   Collection Time: 10/08/20  3:49 AM  Result Value Ref Range   Magnesium 1.8 1.7 - 2.4 mg/dL    Comment: Performed at [facility], [address], [place], [place] [address]  TSH     Status: None   Collection Time: 10/08/20  3:49  AM  Result Value Ref Range   TSH 2.668 0.350 - 4.500 uIU/mL    Comment: Performed by a 3rd Generation assay with a functional sensitivity of <=0.01 uIU/mL. Performed at [facility], [address], [place], [place] [address]     CT Head Wo Contrast  Result Date: 10/07/2020 CLINICAL DATA:  Nontraumatic seizure today EXAM: CT HEAD WITHOUT CONTRAST TECHNIQUE: Contiguous axial images were obtained from the base of the skull through the vertex without intravenous contrast. COMPARISON:   Head CT October 30, 2018 FINDINGS: Brain: No evidence of acute infarction, hemorrhage, hydrocephalus, extra-axial collection or mass lesion/mass effect. Vascular: No hyperdense vessel or unexpected calcification. Skull: Normal. Negative for fracture or focal lesion. Sinuses/Orbits: Scattered mucosal thickening of the paranasal sinuses. The mastoid air cells are predominantly clear. Other: None. IMPRESSION: No acute intracranial abnormality. Electronically Signed   By: Maudry Mayhew MD   On: 10/07/2020 17:54   MR BRAIN W WO CONTRAST  Result Date: 10/08/2020 CLINICAL DATA:  40 year old male with history of 1 prior seizure about 2 years ago, new seizure at work on the day of presentation. EXAM: MRI HEAD WITHOUT AND WITH CONTRAST TECHNIQUE: Multiplanar, multiecho pulse sequences of the brain and surrounding structures were obtained without and with intravenous contrast. CONTRAST:  52mL GADAVIST GADOBUTROL 1 MMOL/ML IV SOLN COMPARISON:  Head CT 10/07/2020 and earlier. FINDINGS: Brain: Cerebral volume appears stable since 2020 and within normal limits. No restricted diffusion to suggest acute infarction. No midline shift, mass effect, extra-axial collection or acute intracranial hemorrhage. Cervicomedullary junction and pituitary are within normal limits. There is no significant ventriculomegaly, no enlargement of the temporal horns and lateral and 3rd ventricle size appears stable since 2020. However, there is a small T2 hyperintense, somewhat cystic appearing lesion at the junction of the 3rd ventricle and cerebral aqueduct (series 6, image 11) which is nonenhancing but demonstrates either chronic blood products or faint calcification (series 7, image 43). The lesion is separate from the pineal gland. And there is mild associated mass effect, parenchymal FLAIR hyperintensity at the dorsal midbrain, colliculus. The area is facilitated on diffusion. The lesion is about 6 mm diameter. No similar intraventricular or  extra-axial lesion is identified elsewhere. However, there is also a small area of chronic microhemorrhage suggested in the anterior right temporal lobe on series 7, image 38. No abnormal intracranial enhancement or dural thickening identified. No cortical encephalomalacia. Thin slice coronal images redemonstrate the dorsal midbrain lesion (series 10, image 6), while hippocampal formations appear symmetric and within normal limits. No migrational abnormality or heterotopia is identified. Vascular: Major intracranial vascular flow voids are preserved. The major dural venous sinuses are enhancing and appear to be patent. The left transverse and sigmoid sinuses appear dominant. Skull and upper cervical spine: Negative visible cervical spine, bone marrow signal. Sinuses/Orbits: Negative orbits. Trace paranasal sinus mucosal thickening. Other: Mastoids are clear. Visible internal auditory structures appear normal. Visible scalp and face soft tissues appear negative. IMPRESSION: 1. 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 Associated faint calcification or chronic blood products with the mass, which does demonstrate faint surrounding midbrain FLAIR hyperintensity, although no convincing obstructive hydrocephalus at this time. Recommend Neurosurgery consultation with broad differential considerations including inclusion cysts (such as an atypical colloid, midline dermoid), post infectious etiology (cysticercosis), and less likely neoplastic (pilocytic astrocytoma). 2. Chronic microhemorrhage or parenchymal calcification also suggested in  the anterior right temporal lobe, but otherwise the MRI appearance of the brain is within normal limits. Electronically Signed   By: Odessa FlemingH  Hall M.D.   On: 10/08/2020 06:15   EEG adult  Result Date: 10/08/2020 Charlsie QuestYadav, Priyanka O, MD     10/08/2020 11:05 AM Patient Name:  Jordan MilroyColin Bowditch MRN: [id number] Epilepsy Attending: Charlsie QuestPriyanka O Yadav Referring Physician/Provider: Dr. Lyda PeroneJared Gardner Date: 10/08/2020 Duration: 24.15 mins Patient history: 40 year old male who presented with generalized tonic-clonic seizure-like episode.  EEG to evaluate for seizures Level of alertness: Awake AEDs during EEG study: Depakote Technical aspects: This EEG study was done with scalp electrodes positioned according to the 10-20 International system of electrode placement. Electrical activity was acquired at a sampling rate of 500Hz  and reviewed with a high frequency filter of 70Hz  and a low frequency filter of 1Hz . EEG data were recorded continuously and digitally stored. Description: The posterior dominant rhythm consists of 10 Hz activity of moderate voltage (25-35 uV) seen predominantly in posterior head regions, symmetric and reactive to eye opening and eye closing. Hyperventilation did not show any EEG change.  Physiologic photic driving was seen during photic stimulation.  IMPRESSION: This study is within normal limits. No seizures or epileptiform discharges were seen throughout the recording. Charlsie QuestPriyanka O Yadav    Review of Systems  Neurological: Positive for seizures.   Blood pressure (!) 162/81, pulse 89, temperature 98.4 F (36.9 C), temperature source Oral, resp. rate 16, height 5\' 11"  (1.803 m), weight 108 kg, SpO2 97 %. Physical Exam Neurological:     Comments: Patient is awake and alert pupils are equal and reactive extraocular movements are intact. Cranial nerves are intact strength is 5-5 upper and lower extremities with no pronator drift.     Assessment/Plan: 40 year old with a small cystic mass in the aqueduct no obstruction no hydrocephalus this would not cause a seizure. Etiology of what this mass is I think remains in question certainly agree with differential put forth by radiology dermoid epidermoid possibly pilocytic Astro. I do not think this represents parasitic cyst like  cysticercosis does not have that imaging in my opinion. I do think this warrants an extensive neurologic work-up may include spinal fluid but certainly is not a surgical lesion. So if unable to make a diagnosis as to the etiology of the mass would recommend following serially with MRI scans. Happy to follow-up as an outpatient does not need to keep him in the hospital from a neurosurgical perspective.  Mariam DollarGary P Riti Rollyson 10/08/2020, 2:21 PM

## 2020-10-08 NOTE — ED Notes (Signed)
Tele   Breakfast ordered  

## 2020-10-08 NOTE — ED Notes (Signed)
EEG complete

## 2020-10-08 NOTE — Consult Note (Addendum)
Neurology Consultation Reason for Consult: Seizure Requesting Physician: Lyda Perone  CC: Seizure  History is obtained from: Patient and wife at bedside as well as chart review  HPI: Jordan Ryan is a 40 y.o. male with a past medical history of anxiety, depression, and one prior generalized tonic-clonic seizure, presenting today with recurrent seizure activity.  He reports he was in his usual state of health recently, had stayed up late on Saturday night drinking (estimates he had 4 to 5 glasses of wine) with friends and did not sleep well on Sunday night as is typical for him at the start of the work week due to his anxiety.  He did have a couple of additional drinks on Sunday night.  He was at work on Monday when he began to feel diaphoretic.  He therefore went outside and started to notice colorful portals in the right side of his vision that were worsening over the course of several seconds.  Due to this feeling worse and worse he decided to try to go back inside to let his coworkers know that he was not feeling well.  Fortunately they saw him walking and could tell from his appearance that he was not well.  One of his coworkers came outside and caught him as he started to collapse.  There is no head strike but he had generalized tonic-clonic shaking for several minutes.  He did bite his tongue though he did not have any incontinence.  Post event he was quite confused about what day it was etc. but this gradually recovered.  He did not feel like he had any focal weakness or vision difficulties after the event.  He has never had visual disturbances like this in the past.  Regarding his seizure history, he did have an episode in 2020 for which he was seen in the ED here.  This event was witnessed by his children and again was generalized shaking.  He does not remember any visual aura at that time.  He had recently started Wellbutrin once this event was chalked up to a provoked seizure secondary to  Wellbutrin as well as drinking heavily the night before (watching the Super Bowl).   On review of systems he has made a recent change in his diet to be more vegan.  Otherwise he denies any signs or symptoms of infection or focal neurological events other than what is described above.  Regarding seizure risk factors, he reports he had a normal birth and development.  He did not have any febrile seizures in childhood.  He had 1 significant head strike in sixth grade where he had a concussion leading to brief loss of consciousness.  He has never had meningitis or encephalitis.  He denies a family history of seizures or developmental delays.  He reports no recent medication changes and denies any use of benzodiazepines for his anxiety.  ROS: A 14 point ROS was performed and is negative except as noted in the HPI.   Past Medical History:  Diagnosis Date  . Anxiety   . Depression   . Seizure (HCC) 2020   thought to be from wellbutrin     Family History  Problem Relation Age of Onset  . Aneurysm Mother   . Depression Mother   . Depression Brother   . Diabetes Maternal Grandfather      Social History:  reports that he has never smoked. He quit smokeless tobacco use about 9 years ago. He reports current alcohol use of about  10.0 - 12.0 standard drinks of alcohol per week. He reports that he does not use drugs.  Exam: Current vital signs: BP (!) 154/98   Pulse 85   Temp 98.2 F (36.8 C) (Oral)   Resp 20   Ht 5\' 11"  (1.803 m)   Wt 108 kg   SpO2 99%   BMI 33.19 kg/m  Vital signs in last 24 hours: Temp:  [98.2 F (36.8 C)-98.6 F (37 C)] 98.2 F (36.8 C) (01/11 2247) Pulse Rate:  [82-121] 85 (01/12 0005) Resp:  [18-22] 20 (01/12 0005) BP: (140-154)/(98-113) 154/98 (01/12 0005) SpO2:  [99 %-100 %] 99 % (01/12 0005) Weight:  03-05-2000 kg] 108 kg (01/12 0021)   Physical Exam  Constitutional: Appears well-developed and well-nourished.  Psych: Affect appropriate to situation Eyes:  No scleral injection HENT: No OP obstruction, however he does have significant tongue bite and swelling MSK: no joint deformities.  Cardiovascular: Normal rate and regular rhythm.  Respiratory: Effort normal, non-labored breathing GI: Soft.  No distension. There is no tenderness.  Skin: WDI  Neuro: Mental Status: Patient is awake, alert, oriented to person, place, month, year, and situation. Patient is able to give a clear and coherent history. No signs of aphasia or neglect Cranial Nerves: II: Visual Fields are full. Pupils are equal, round, and reactive to light.   III,IV, VI: EOMI without ptosis or diploplia.  V: Facial sensation is symmetric to temperature VII: Facial movement is symmetric.  VIII: hearing is intact to voice X: Uvula elevates symmetrically XI: Shoulder shrug is symmetric. XII: tongue is midline without atrophy or fasciculations.  Motor: Tone is normal. Bulk is normal. 5/5 strength was present in all four extremities.  Sensory: Sensation is symmetric to light touch and temperature in the arms and legs. Deep Tendon Reflexes: 2+ and symmetric in the biceps and patellae.  Plantars: Toes are downgoing bilaterally.  Cerebellar: FNF and HKS are intact bilaterally   I have reviewed labs in epic and the results pertinent to this consultation are: CK greater than 2000 New AKI to 1.49 (baseline creatinine 0.9) U tox negative, respiratory viral panel negative  I have reviewed the images obtained:  Head CT without acute intracranial abnormality   Impression: This is a 40 year old man with past medical history as above presenting with a focal seizure with retained awareness and secondary generalization.  Triggers for the current event is likely alcohol consumption as well as sleep deprivation.  Patient was counseled on seizure precautions as below.  Given his history of anxiety/depression, will avoid levetiracetam in favor of Depakote.  He will also need seizure  work-up (MRI and EEG)  Recommendations: -MRI brain with and without contrast -Routine EEG -CK every 12 hours until peak, IV fluids to manage potential rhabdomyolysis per primary team -Depakote 250 mg QHS and 125 mg QAM for total daily dose of 375 (11.29 mg/kg) Standard seizure precautions: Per [facility] statutes, patients with seizures are not allowed to drive until  they have been seizure-free for six months. Use caution when using heavy equipment or power tools. Avoid working on ladders or at heights. Take showers instead of baths. Ensure the water temperature is not too high on the home water heater. Do not go swimming alone. When caring for infants or small children, sit down when holding, feeding, or changing them to minimize risk of injury to the child in the event you have a seizure.  To reduce risk of seizures, maintain good sleep hygiene avoid alcohol and  illicit drug use, take all anti-seizure medications as prescribed.   Brooke Dare MD-PhD [facility] [phone]

## 2020-10-08 NOTE — Procedures (Signed)
Patient Name: Jordan Ryan  MRN: [id number]  Epilepsy Attending: Charlsie Quest  Referring Physician/Provider: Dr. Lyda Perone Date: 10/08/2020  Duration: 24.15 mins  Patient history: 40 year old male who presented with generalized tonic-clonic seizure-like episode.  EEG to evaluate for seizures  Level of alertness: Awake  AEDs during EEG study: Depakote  Technical aspects: This EEG study was done with scalp electrodes positioned according to the 10-20 International system of electrode placement. Electrical activity was acquired at a sampling rate of 500Hz  and reviewed with a high frequency filter of 70Hz  and a low frequency filter of 1Hz . EEG data were recorded continuously and digitally stored.   Description: The posterior dominant rhythm consists of 10 Hz activity of moderate voltage (25-35 uV) seen predominantly in posterior head regions, symmetric and reactive to eye opening and eye closing. Hyperventilation did not show any EEG change.  Physiologic photic driving was seen during photic stimulation.    IMPRESSION: This study is within normal limits. No seizures or epileptiform discharges were seen throughout the recording.  Kimela Malstrom 

## 2020-10-08 NOTE — ED Notes (Signed)
Lab called to add on urinalysis. 

## 2020-10-08 NOTE — Progress Notes (Signed)
Patient seen and examined, admitted earlier this morning by Dr. Julian Reil, Mr. Jordan Ryan is a 40 year old male with history of anxiety, depression, had a seizure about 2 years ago which was attributed to Wellbutrin. 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

## 2020-10-08 NOTE — Progress Notes (Signed)
EEG complete - results pending 

## 2020-10-08 NOTE — H&P (Addendum)
History and Physical    Jordan Ryan KDX:[id number] DOB: Jul 28, 1981 DOA: 10/07/2020  PCP: Patient, No Pcp Per  Patient coming from: Home  I have personally briefly reviewed patient's old medical records in [facility]  Chief Complaint: Seizure  HPI: Jordan Ryan is a 40 y.o. male with medical history significant of depression.  Had seizure 1 time in past x2 years ago, thought to be due to wellbutrin.  Wellbutrin stopped, pt on no chronic seizure meds.  Pt presents to ED after seizure after work today.  Pt was at work, started feeling hot, went outside to cool off.  Noticed visual disturbance in R peripheral vision.  Started to go back inside to tell co-workers.  Next thing he knew he was on ground surrounded by EMS.  Witnessed 58m seizure, generalized clonic tonic activity.  Did bite tongue, no urinary incontinence.  No recent illness, fever, chills, CP, etc.   ED Course: CT head neg.  W/u unremarkable thus far.   Review of Systems: As per HPI, otherwise all review of systems negative.  Past Medical History:  Diagnosis Date  . Anxiety   . Depression   . Seizure (HCC) 2020   thought to be from wellbutrin    Past Surgical History:  Procedure Laterality Date  . CYST REMOVAL NECK Left 2006   epidermal inclusion cyst     reports that he has never smoked. He quit smokeless tobacco use about 9 years ago. He reports current alcohol use of about 10.0 - 12.0 standard drinks of alcohol per week. He reports that he does not use drugs.  Allergies  Allergen Reactions  . Wellbutrin [Bupropion] Other (See Comments)    Seizure    Family History  Problem Relation Age of Onset  . Aneurysm Mother   . Depression Mother   . Depression Brother   . Diabetes Maternal Grandfather      Prior to Admission medications   Medication Sig Start Date End Date Taking? Authorizing Provider  hydrOXYzine (ATARAX/VISTARIL) 10 MG tablet Take 20 mg by mouth at bedtime as needed for anxiety.   Yes  [provider]  prazosin (MINIPRESS) 1 MG capsule Take 1 mg by mouth at bedtime.   Yes [provider]  sertraline (ZOLOFT) 50 MG tablet Take 100 mg by mouth at bedtime.   Yes [provider]    Physical Exam: Vitals:   10/07/20 1947 10/07/20 2247 10/08/20 0005 10/08/20 0021  BP: (!) 149/101 (!) 140/100 (!) 154/98   Pulse: 96 82 85   Resp: (!) 22 (!) 22 20   Temp: 98.6 F (37 C) 98.2 F (36.8 C)    TempSrc: Oral Oral    SpO2: 100% 100% 99%   Weight:    108 kg  Height:    5\' 11"  (1.803 m)    Constitutional: NAD, calm, comfortable Eyes: PERRL, lids and conjunctivae normal ENMT: Mucous membranes are moist. Posterior pharynx clear of any exudate or lesions.Normal dentition.  Neck: normal, supple, no masses, no thyromegaly Respiratory: clear to auscultation bilaterally, no wheezing, no crackles. Normal respiratory effort. No accessory muscle use.  Cardiovascular: Regular rate and rhythm, no murmurs / rubs / gallops. No extremity edema. 2+ pedal pulses. No carotid bruits.  Abdomen: no tenderness, no masses palpated. No hepatosplenomegaly. Bowel sounds positive.  Musculoskeletal: no clubbing / cyanosis. No joint deformity upper and lower extremities. Good ROM, no contractures. Normal muscle tone.  Skin: no rashes, lesions, ulcers. No induration Neurologic: CN 2-12 grossly intact.  Sensation intact, DTR normal. Strength 5/5 in all 4.  Psychiatric: Normal judgment and insight. Alert and oriented x 3. Normal mood.    Labs on Admission: I have personally reviewed following labs and imaging studies  CBC: No results for input(s): WBC, NEUTROABS, HGB, HCT, MCV, PLT in the last 168 hours. Basic Metabolic Panel: Recent Labs  Lab 10/07/20 1615  NA 137  K 4.1  CL 100  CO2 21*  GLUCOSE 82  BUN 8  CREATININE 1.49*  CALCIUM 9.4   GFR: Estimated Creatinine Clearance: 84.9 mL/min (A) (by C-G formula based on SCr of 1.49 mg/dL (H)). Liver Function Tests: No  results for input(s): AST, ALT, ALKPHOS, BILITOT, PROT, ALBUMIN in the last 168 hours. No results for input(s): LIPASE, AMYLASE in the last 168 hours. No results for input(s): AMMONIA in the last 168 hours. Coagulation Profile: No results for input(s): INR, PROTIME in the last 168 hours. Cardiac Enzymes: No results for input(s): CKTOTAL, CKMB, CKMBINDEX, TROPONINI in the last 168 hours. BNP (last 3 results) No results for input(s): PROBNP in the last 8760 hours. HbA1C: No results for input(s): HGBA1C in the last 72 hours. CBG: No results for input(s): GLUCAP in the last 168 hours. Lipid Profile: No results for input(s): CHOL, HDL, LDLCALC, TRIG, CHOLHDL, LDLDIRECT in the last 72 hours. Thyroid Function Tests: No results for input(s): TSH, T4TOTAL, FREET4, T3FREE, THYROIDAB in the last 72 hours. Anemia Panel: No results for input(s): VITAMINB12, FOLATE, FERRITIN, TIBC, IRON, RETICCTPCT in the last 72 hours. Urine analysis:    Component Value Date/Time   COLORURINE AMBER (A) 10/30/2018 2010   APPEARANCEUR HAZY (A) 10/30/2018 2010   LABSPEC 1.025 10/30/2018 2010   PHURINE 6.0 10/30/2018 2010   GLUCOSEU NEGATIVE 10/30/2018 2010   HGBUR NEGATIVE 10/30/2018 2010   BILIRUBINUR NEGATIVE 10/30/2018 2010   KETONESUR 80 (A) 10/30/2018 2010   PROTEINUR 30 (A) 10/30/2018 2010   NITRITE NEGATIVE 10/30/2018 2010   LEUKOCYTESUR NEGATIVE 10/30/2018 2010    Radiological Exams on Admission: CT Head Wo Contrast  Result Date: 10/07/2020 CLINICAL DATA:  Nontraumatic seizure today EXAM: CT HEAD WITHOUT CONTRAST TECHNIQUE: Contiguous axial images were obtained from the base of the skull through the vertex without intravenous contrast. COMPARISON:  Head CT October 30, 2018 FINDINGS: Brain: No evidence of acute infarction, hemorrhage, hydrocephalus, extra-axial collection or mass lesion/mass effect. Vascular: No hyperdense vessel or unexpected calcification. Skull: Normal. Negative for fracture or focal  lesion. Sinuses/Orbits: Scattered mucosal thickening of the paranasal sinuses. The mastoid air cells are predominantly clear. Other: None. IMPRESSION: No acute intracranial abnormality. Electronically Signed   By: Maudry Mayhew MD   On: 10/07/2020 17:54    EKG: Independently reviewed.  Assessment/Plan Principal Problem:   Seizure (HCC) Active Problems:   Depression    1. Seizure - 1. Neuro wants obs admit 2. Seizure precautions 3. Tele monitor 4. EEG 5. Neuro to order MRI 6. Check Mg 7. Check TSH 8. Check UDS, check EtOH 9. Neuro to order seizure med(s) 2. Depression - 1. Cont home zoloft 3. Mildly Elevated CK, creat 1.5 - 1. IVF: 2L bolus and LR at 125 2. Trend CK Q8H 3. Repeat BMP  DVT prophylaxis: Early ambulation Code Status: Full Family Communication: Family at bedside Disposition Plan: Home, probably in AM after seizure work up [employer] called: Neurology Admission status: Place in [place], Othella Slappey M. DO [facility]  How to contact the [facility] Attending or Consulting provider 7A - 7P or  covering provider during after hours 7P -7A, for this patient?  1. Check the care team in [facility] and look for a) attending/consulting [facility] provider listed and b) the [facility] team listed 2. Log into [email or web address]  Amion Physician Scheduling and messaging for groups and whole hospitals  On call and physician scheduling software for group practices, residents, hospitalists and other medical providers for call, clinic, rotation and shift schedules. On[employer] is a hospital-wide system for scheduling doctors and paging doctors on call. EasyPlot is for scientific plotting and data analysis.  [email or web address]  and use [place]'s universal password to access. If you do not have the password, please contact the hospital operator.  3. Locate the [facility] provider you are looking for under [facility] and page to a number that you can be directly reached. 4. If you still have  difficulty reaching the provider, please page the [facility] (Director on Call) for the Hospitalists listed on amion for assistance.  10/08/2020, 1:22 AM

## 2020-10-08 NOTE — ED Provider Notes (Signed)
[facility] EMERGENCY DEPARTMENT Provider Note   CSN: [id number] Arrival date & time: 10/07/20  1606     History Chief Complaint  Patient presents with  . Seizures    Jordan Ryan is a 40 y.o. male.  Patient presents to the emergency department for evaluation of seizure. Patient reports he was at work when he started to get hot. He went outside to cool off. He then started to notice visual disturbances in the right peripheral vision. He started to go back inside to tell his coworkers that he was not feeling well, the next thing he remembers is waking up on the ground with EMS around him.  Patient had a witnessed seizure that lasted approximately 2 minutes. He was exhibiting generalized tonic-clonic seizure activity. He did bite his tongue, no incontinence of urine.        Past Medical History:  Diagnosis Date  . Anxiety   . Depression   . Seizure (HCC) 2020   thought to be from wellbutrin    Patient Active Problem List   Diagnosis Date Noted  . Seizure (HCC) 10/08/2020  . Anxiety 03/28/2017  . Depression 03/28/2017    Past Surgical History:  Procedure Laterality Date  . CYST REMOVAL NECK Left 2006   epidermal inclusion cyst       Family History  Problem Relation Age of Onset  . Aneurysm Mother   . Depression Mother   . Depression Brother   . Diabetes Maternal Grandfather     Social History   Tobacco Use  . Smoking status: Never Smoker  . Smokeless tobacco: Former Clinical biochemist  . Vaping Use: Never used  Substance Use Topics  . Alcohol use: Yes    Alcohol/week: 10.0 - 12.0 standard drinks    Types: 10 - 12 Cans of beer per week  . Drug use: No    Home Medications Prior to Admission medications   Medication Sig Start Date End Date Taking? Authorizing Provider  mupirocin ointment (BACTROBAN) 2 % Apply to affected area 1-2 times daily 08/06/19   Jarold Motto, PA  topiramate (TOPAMAX) 25 MG tablet TAKE TWO TABLETS AT BEDTIME.  12/23/19   Mozingo, Thereasa Solo, NP    Allergies    Wellbutrin [bupropion]  Review of Systems   Review of Systems  Neurological: Positive for seizures.  All other systems reviewed and are negative.   Physical Exam Updated Vital Signs BP (!) 154/98   Pulse 85   Temp 98.2 F (36.8 C) (Oral)   Resp 20   Ht 5\' 11"  (1.803 m)   Wt 108 kg   SpO2 99%   BMI 33.19 kg/m   Physical Exam Vitals and nursing note reviewed.  Constitutional:      General: He is not in acute distress.    Appearance: Normal appearance. He is well-developed and well-nourished.  HENT:     Head: Normocephalic.     Comments: Abrasions to tongue    Right Ear: Hearing normal.     Left Ear: Hearing normal.     Nose: Nose normal.     Mouth/Throat:     Mouth: Oropharynx is clear and moist and mucous membranes are normal.  Eyes:     Extraocular Movements: EOM normal.     Conjunctiva/sclera: Conjunctivae normal.     Pupils: Pupils are equal, round, and reactive to light.  Cardiovascular:     Rate and Rhythm: Regular rhythm.     Heart sounds: S1 normal and  S2 normal. No murmur heard. No friction rub. No gallop.   Pulmonary:     Effort: Pulmonary effort is normal. No respiratory distress.     Breath sounds: Normal breath sounds.  Chest:     Chest wall: No tenderness.  Abdominal:     General: Bowel sounds are normal.     Palpations: Abdomen is soft. There is no hepatosplenomegaly.     Tenderness: There is no abdominal tenderness. There is no guarding or rebound. Negative signs include Murphy's sign and McBurney's sign.     Hernia: No hernia is present.  Musculoskeletal:        General: Normal range of motion.     Cervical back: Normal range of motion and neck supple.  Skin:    General: Skin is warm, dry and intact.     Findings: No rash.     Nails: There is no cyanosis.  Neurological:     Mental Status: He is alert and oriented to person, place, and time.     GCS: GCS eye subscore is 4. GCS verbal  subscore is 5. GCS motor subscore is 6.     Cranial Nerves: No cranial nerve deficit.     Sensory: No sensory deficit.     Coordination: Coordination normal.     Deep Tendon Reflexes: Strength normal.  Psychiatric:        Mood and Affect: Mood and affect normal.        Speech: Speech normal.        Behavior: Behavior normal.        Thought Content: Thought content normal.     ED Results / Procedures / Treatments   Labs (all labs ordered are listed, but only abnormal results are displayed) Labs Reviewed  BASIC METABOLIC PANEL - Abnormal; Notable for the following components:      Result Value   CO2 21 (*)    Creatinine, Ser 1.49 (*)    Anion gap 16 (*)    All other components within normal limits  RESP PANEL BY RT-PCR (FLU A&B, COVID) ARPGX2  CK  CBC  MAGNESIUM  RAPID URINE DRUG SCREEN, HOSP PERFORMED  ETHANOL  HIV ANTIBODY (ROUTINE TESTING W REFLEX)  BASIC METABOLIC PANEL    EKG EKG Interpretation  Date/Time:  Tuesday October 07 2020 16:08:57 EST Ventricular Rate:  119 PR Interval:  156 QRS Duration: 94 QT Interval:  324 QTC Calculation: 455 R Axis:   68 Text Interpretation: Sinus tachycardia Possible Inferior infarct , age undetermined Possible Anterolateral infarct , age undetermined Abnormal ECG Confirmed by Gilda Crease [phone]) on 10/08/2020 12:06:48 AM   Radiology CT Head Wo Contrast  Result Date: 10/07/2020 CLINICAL DATA:  Nontraumatic seizure today EXAM: CT HEAD WITHOUT CONTRAST TECHNIQUE: Contiguous axial images were obtained from the base of the skull through the vertex without intravenous contrast. COMPARISON:  Head CT October 30, 2018 FINDINGS: Brain: No evidence of acute infarction, hemorrhage, hydrocephalus, extra-axial collection or mass lesion/mass effect. Vascular: No hyperdense vessel or unexpected calcification. Skull: Normal. Negative for fracture or focal lesion. Sinuses/Orbits: Scattered mucosal thickening of the paranasal sinuses. The  mastoid air cells are predominantly clear. Other: None. IMPRESSION: No acute intracranial abnormality. Electronically Signed   By: Maudry Mayhew MD   On: 10/07/2020 17:54    Procedures Procedures (including critical care time)  Medications Ordered in ED Medications  sodium chloride 0.9 % bolus 1,000 mL (has no administration in time range)    Followed by  sodium chloride  0.9 % bolus 1,000 mL (1,000 mLs Intravenous New Bag/Given 10/08/20 0056)  acetaminophen (TYLENOL) tablet 650 mg (has no administration in time range)    Or  acetaminophen (TYLENOL) suppository 650 mg (has no administration in time range)  ondansetron (ZOFRAN) tablet 4 mg (has no administration in time range)    Or  ondansetron (ZOFRAN) injection 4 mg (has no administration in time range)    ED Course  I have reviewed the triage vital signs and the nursing notes.  Pertinent labs & imaging results that were available during my care of the patient were reviewed by me and considered in my medical decision making (see chart for details).    MDM Rules/Calculators/A&P                          Patient awake, alert and oriented at this time. Episode sounds very convincing for seizure. This would be his second lifetime seizure. Patient did have a seizure in 2020 which was felt to possibly have been provoked by Wellbutrin use. He has no longer taking Wellbutrin.  Presentation and work-up discussed with Dr. Iver Nestle, on-call for neurology.  Recommends aggressive fluid resuscitation for acute kidney injury, hospitalist admission for further seizure work-up including EEG and MRI.  Dr.Bhagat will see patient and determine appropriate antiepileptic treatment.  Final Clinical Impression(s) / ED Diagnoses Final diagnoses:  Seizure [facility])    Rx / DC Orders ED Discharge Orders    None       Darryle Dennie, Canary Brim, MD 10/08/20 0109

## 2020-10-09 ENCOUNTER — Other Ambulatory Visit (HOSPITAL_COMMUNITY): Payer: Self-pay | Admitting: Internal Medicine

## 2020-10-09 DIAGNOSIS — R569 Unspecified convulsions: Secondary | ICD-10-CM | POA: Diagnosis not present

## 2020-10-09 LAB — CBC
HCT: 38.2 % — ABNORMAL LOW (ref 39.0–52.0)
Hemoglobin: 13 g/dL (ref 13.0–17.0)
MCH: 31.6 pg (ref 26.0–34.0)
MCHC: 34 g/dL (ref 30.0–36.0)
MCV: 92.7 fL (ref 80.0–100.0)
Platelets: 169 10*3/uL (ref 150–400)
RBC: 4.12 MIL/uL — ABNORMAL LOW (ref 4.22–5.81)
RDW: 11.9 % (ref 11.5–15.5)
WBC: 7 10*3/uL (ref 4.0–10.5)
nRBC: 0 % (ref 0.0–0.2)

## 2020-10-09 LAB — COMPREHENSIVE METABOLIC PANEL
ALT: 50 U/L — ABNORMAL HIGH (ref 0–44)
AST: 127 U/L — ABNORMAL HIGH (ref 15–41)
Albumin: 2.9 g/dL — ABNORMAL LOW (ref 3.5–5.0)
Alkaline Phosphatase: 47 U/L (ref 38–126)
Anion gap: 10 (ref 5–15)
BUN: <5 mg/dL — ABNORMAL LOW (ref 6–20)
CO2: 25 mmol/L (ref 22–32)
Calcium: 8.4 mg/dL — ABNORMAL LOW (ref 8.9–10.3)
Chloride: 104 mmol/L (ref 98–111)
Creatinine, Ser: 0.77 mg/dL (ref 0.61–1.24)
GFR, Estimated: >60 mL/min (ref >60)
Glucose, Bld: 107 mg/dL — ABNORMAL HIGH (ref 70–99)
Potassium: 3.3 mmol/L — ABNORMAL LOW (ref 3.5–5.1)
Sodium: 139 mmol/L (ref 135–145)
Total Bilirubin: 0.5 mg/dL (ref 0.3–1.2)
Total Protein: 5.4 g/dL — ABNORMAL LOW (ref 6.5–8.1)

## 2020-10-09 LAB — CK: Total CK: 10480 U/L — ABNORMAL HIGH (ref 49–397)

## 2020-10-09 MED ORDER — DIVALPROEX SODIUM 125 MG PO DR TAB: 125.0000 mg | DELAYED_RELEASE_TABLET | Freq: Every day | ORAL | 0 refills | Status: AC

## 2020-10-09 MED ORDER — ACETAMINOPHEN 325 MG PO TABS: 650.0000 mg | ORAL_TABLET | Freq: Four times a day (QID) | ORAL | Status: AC | PRN

## 2020-10-09 MED ORDER — DIVALPROEX SODIUM 250 MG PO DR TAB: 250.0000 mg | DELAYED_RELEASE_TABLET | Freq: Every day | ORAL | 0 refills | Status: DC

## 2020-10-09 MED ORDER — DIVALPROEX SODIUM 125 MG PO DR TAB: 125.0000 mg | DELAYED_RELEASE_TABLET | Freq: Every day | ORAL | 0 refills | Status: DC

## 2020-10-09 MED FILL — DIVALPROEX SODIUM 125 MG CA: 125 | 14 days supply | Qty: 14 | Fill #0

## 2020-10-09 MED FILL — DIVALPROEX SOD DR 250 MG TA: 250 | 14 days supply | Qty: 14 | Fill #0

## 2020-10-09 NOTE — Discharge Summary (Signed)
Physician Discharge Summary  Chancy MilroyColin Demetrius ZOX:[id number] DOB: 08/14/1981 DOA: 10/07/2020  PCP: Patient, No Pcp Per  Admit date: 10/07/2020 Discharge date: 10/09/2020  Admitted From: Home  Disposition:  Home  Recommendations for Outpatient Follow-up:  1. Follow up with PCP in 1-2 weeks 2. Follow up with Neurology in 6 weeks 3. Follow up with Neurosurgery in 6-8 weeks  Home Health:No Equipment/Devices:No  Discharge Condition:Stable CODE STATUS:Full Diet recommendation: Regular diet  Brief/Interim Summary: Summary: He had no recurrent seizure activity. 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 He was seen by Neurology with recommendation to stay on anti seizure medications and to follow up with outpatient Neurology.   Please see below for further details of this hospital course prior to the date of discharge:   Mr. Damita LackMackey is a 40 year old male with history of anxiety, depression, had a seizure about 2 years ago which was attributed to Wellbutrin. 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 Melynda RippleYadav, on 1/13:  "Recommendations -Routine EEG did not  show any evidence of epileptogenicity. -Cystic-appearing mass on MRI is unlikely the cause of patient's seizure.  There is a lesion which is possibly microhemorrhage parenchymal calcification in the anterior right temporal lobe which could potentially be the source of patient's seizures and can be evaluated further if patient's seizures are not controlled on antiseizure medications -Agree with valproic acid as started by Dr. Iver NestleBhagat.  It is a relatively low dose and therefore if patient has any further auras or seizures, this can be titrated up -Recommend follow-up with neurology in 6 to 8 weeks -Recommend seizure precautions including do not drive for 6 months"  Discharge Diagnoses:  Principal Problem:   Seizure [facility](HCC) Active Problems:   Depression    Discharge Instructions  Discharge Instructions    Diet - low sodium heart healthy   Complete by: As directed    Increase activity slowly   Complete by: As directed      Allergies as of 10/09/2020      Reactions   Wellbutrin [bupropion] Other (See Comments)   Seizure      Medication List    TAKE these medications   acetaminophen 325 MG tablet Commonly known as: TYLENOL Take 2 tablets (650 mg total) by mouth every 6 (six) hours as needed for mild pain (or Fever >/= 101).   divalproex 125 MG DR tablet Commonly known as: DEPAKOTE Take 1 tablet (125 mg total) by mouth daily with breakfast.   divalproex 250 MG DR tablet Commonly known as: DEPAKOTE Take 1 tablet (250 mg total) by mouth at bedtime.   hydrOXYzine 10 MG tablet Commonly known as: ATARAX/VISTARIL Take 20 mg by mouth at bedtime as needed for anxiety.   prazosin 1 MG capsule Commonly known as: MINIPRESS Take 1 mg by  mouth at bedtime.   sertraline 50 MG tablet Commonly known as: ZOLOFT Take 100 mg by mouth at bedtime.       Follow-up Information    Orvilla Cornwallurner, Linda, MD. Schedule an appointment as soon as possible for a visit.   Specialty: [facility] Medicine [facility]: [facility]         Charlsie QuestYadav, Priyanka O, MD. Schedule an appointment as soon as possible for a visit in 6 week(s).   Specialty: Neurology Contact information: [address] [place] [address] [phone]        Donalee Citrinram, Gary, MD. Schedule an appointment as soon as possible for a visit in 6 week(s).   Specialty: Neurosurgery Contact information: [address] [place] [address] [phone]              Allergies  Allergen Reactions   Wellbutrin [Bupropion] Other (See Comments)    Seizure    Consultations:  Neurology  Neurosurgery   Procedures/Studies: CT Head Wo Contrast  Result Date: 10/07/2020 CLINICAL DATA:  Nontraumatic seizure today EXAM: CT HEAD WITHOUT CONTRAST TECHNIQUE: Contiguous axial images were obtained from the base of the skull through the vertex without intravenous contrast. COMPARISON:  Head CT October 30, 2018 FINDINGS: Brain: No evidence of acute infarction, hemorrhage, hydrocephalus, extra-axial collection or mass lesion/mass effect. Vascular: No hyperdense vessel or unexpected calcification. Skull: Normal. Negative for fracture or focal lesion. Sinuses/Orbits: Scattered mucosal thickening of the paranasal sinuses. The mastoid air cells are predominantly clear. Other: None. IMPRESSION: No acute intracranial abnormality. Electronically Signed   By: Maudry MayhewJeffrey  Waltz MD   On: 10/07/2020 17:54   MR BRAIN W WO CONTRAST  Result Date: 10/08/2020 CLINICAL DATA:  40 year old male with history of 1 prior seizure about 2 years ago, new seizure at work on the day of presentation. EXAM: MRI HEAD WITHOUT AND WITH CONTRAST TECHNIQUE: Multiplanar, multiecho pulse sequences of the brain and surrounding structures were obtained without and with intravenous contrast. CONTRAST:  10mL GADAVIST GADOBUTROL 1 MMOL/ML IV SOLN COMPARISON:  Head CT 10/07/2020 and earlier. FINDINGS: Brain: Cerebral volume appears stable since 2020 and within normal limits. No restricted  diffusion to suggest acute infarction. No midline shift, mass effect, extra-axial collection or acute intracranial hemorrhage. Cervicomedullary junction and pituitary are within normal limits. There is no significant ventriculomegaly, no enlargement of the temporal horns and lateral and 3rd ventricle size appears stable since 2020. However, there is a small T2 hyperintense, somewhat cystic appearing lesion at the junction of the 3rd ventricle and cerebral aqueduct (series 6, image 11) which is nonenhancing but demonstrates either chronic blood products or faint calcification (series 7, image 43). The lesion is separate from the pineal gland. And there is mild associated mass effect, parenchymal FLAIR hyperintensity at the dorsal midbrain, colliculus. The area is facilitated on diffusion. The lesion is about 6 mm diameter. No similar intraventricular or extra-axial lesion is identified elsewhere. However, there is also a small area of chronic microhemorrhage suggested in the anterior right temporal lobe on series 7, image 38. No abnormal intracranial enhancement or dural thickening identified. No cortical encephalomalacia. Thin slice coronal images redemonstrate the dorsal midbrain lesion (series 10, image 6), while hippocampal formations appear symmetric and within normal limits. No migrational abnormality or heterotopia is identified. Vascular: Major intracranial vascular flow voids are preserved. The major dural venous sinuses are enhancing and appear to be patent. The left transverse and sigmoid sinuses appear dominant. Skull and upper cervical spine: Negative visible cervical spine, bone  marrow signal. Sinuses/Orbits: Negative orbits. Trace paranasal sinus mucosal thickening. Other: Mastoids are clear. Visible internal auditory structures appear normal. Visible scalp and face soft tissues appear negative. IMPRESSION: 1. 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 Associated faint calcification or chronic blood products with the mass, which does demonstrate faint surrounding midbrain FLAIR hyperintensity, although no convincing obstructive hydrocephalus at this time. Recommend Neurosurgery consultation with broad differential considerations including inclusion cysts (such as an atypical colloid, midline dermoid), post infectious etiology (cysticercosis), and less likely neoplastic (pilocytic astrocytoma). 2. Chronic microhemorrhage or parenchymal calcification also suggested in the anterior right temporal lobe, but otherwise the MRI appearance of the brain is within normal limits. Electronically Signed   By: Odessa Fleming M.D.   On: 10/08/2020 06:15   EEG adult  Result Date: 10/08/2020 Charlsie Quest, MD     10/08/2020 11:05 AM Patient Name: Damieon Armendariz MRN: [id number] Epilepsy Attending: Charlsie Quest Referring Physician/Provider: Dr. Lyda Perone Date: 10/08/2020 Duration: 24.15 mins Patient history: 40 year old male who presented with generalized tonic-clonic seizure-like episode.  EEG to evaluate for seizures Level of alertness: Awake AEDs during EEG study: Depakote Technical aspects: This EEG study was done with scalp electrodes positioned according to the 10-20 International system of electrode placement. Electrical activity was acquired at a sampling rate of 500Hz  and reviewed with a high frequency filter of 70Hz  and a low frequency filter of 1Hz . EEG data were recorded continuously and digitally stored. Description: The posterior dominant rhythm consists of 10 Hz activity of moderate voltage (25-35 uV) seen predominantly in posterior head regions, symmetric and reactive to eye opening and eye closing. Hyperventilation did not show any EEG change.  Physiologic photic driving was seen during photic stimulation.  IMPRESSION: This study is within normal limits. No seizures or epileptiform  discharges were seen throughout the recording.    EEG--no seizure activity    Subjective: He reports feeling well with no headache, changes in vision.   Discharge Exam: Vitals:   10/09/20 0512 10/09/20 1156  BP: 120/69 (!) 146/83  Pulse: 81 66  Resp: 17 17  Temp: 98.5 F (36.9 C) 98.4 F (36.9 C)  SpO2: 98% 97%   Vitals:   10/08/20 1818 10/08/20 2351 10/09/20 0512 10/09/20 1156  BP: 136/83 133/72 120/69 (!) 146/83  Pulse: 75 68 81 66  Resp: 16 20 17 17   Temp: 98.6 F (37 C) 98.5 F (36.9 C) 98.5 F (36.9 C) 98.4 F (36.9 C)  TempSrc: Oral Oral Oral Oral  SpO2: 98% 96% 98% 97%  Weight:      Height:        General: Pt is alert, awake, not in acute distress Cardiovascular: RRR, S1/S2 + Respiratory: No cough, no respiratory failure Abdominal: Soft, NT, ND Extremities: no edema, no cyanosis    The results of significant diagnostics from this hospitalization (including imaging, microbiology, ancillary and laboratory) are listed below for reference.     Microbiology: Recent Results (from the past 240 hour(s))  Resp Panel by RT-PCR (Flu A&B, Covid) Nasopharyngeal Swab     Status: None   Collection Time: 10/08/20 12:44 AM   Specimen: Nasopharyngeal Swab; Nasopharyngeal(NP) swabs in vial transport medium  Result Value Ref Range Status   SARS Coronavirus 2 by RT PCR NEGATIVE NEGATIVE Final    Comment: (NOTE) SARS-CoV-2 target nucleic acids are NOT DETECTED.  The SARS-CoV-2 RNA is generally detectable in upper respiratory specimens during the acute phase of infection. The lowest concentration of SARS-CoV-2 viral copies this assay can detect is 138 copies/mL. A negative result does not preclude SARS-Cov-2 infection and should not be used as the sole basis for treatment or other patient management decisions. A negative result may occur with  improper specimen collection/handling, submission of specimen other than nasopharyngeal swab, presence of  viral mutation(s) within the areas targeted by this assay, and inadequate number of viral copies(<138 copies/mL). A negative result must be combined with clinical observations, patient history, and epidemiological information. The expected result is Negative.  Fact Sheet for Patients:  BloggerCourse.com  Fact Sheet for Healthcare Providers:  SeriousBroker.it  This test is no t yet approved or cleared by the [place] FDA and  has been authorized for detection and/or diagnosis of SARS-CoV-2 by FDA under an Emergency Use Authorization (EUA). This EUA will remain  in effect (meaning this test can be used) for the duration of the COVID-19 declaration under Section 564(b)(1) of the Act, 21 U.S.C.section 360bbb-3(b)(1), unless the authorization is terminated  or revoked sooner.       Influenza A by PCR NEGATIVE NEGATIVE Final   Influenza B by PCR NEGATIVE NEGATIVE Final    Comment: (NOTE) The Xpert Xpress SARS-CoV-2/FLU/RSV plus assay is intended as an aid in the diagnosis of influenza from Nasopharyngeal swab specimens and should not be used as a sole basis for treatment. Nasal washings and aspirates are unacceptable for Xpert Xpress SARS-CoV-2/FLU/RSV testing.  Fact Sheet for Patients: BloggerCourse.com  Fact Sheet for Healthcare Providers: SeriousBroker.it  This test is not yet approved or cleared by the [place] FDA and has been authorized for detection and/or diagnosis of SARS-CoV-2 by FDA under an Emergency Use Authorization (EUA). This EUA will remain in effect (meaning this test can be used) for the duration of the COVID-19 declaration under Section 564(b)(1) of the Act, 21 U.S.C. section 360bbb-3(b)(1), unless the authorization is terminated or revoked.  Performed at [facility], [address] [place], [place] [address]      Labs: BNP (last 3  results) No results for input(s): BNP in the last 8760 hours. Basic Metabolic Panel: Recent Labs  Lab 10/07/20 1615 10/08/20 0349 10/09/20 0329  NA 137 137 139  K 4.1 3.8 3.3*  CL 100 102 104  CO2 21* 24 25  GLUCOSE 82 100* 107*  BUN 8 7 <5*  CREATININE 1.49* 1.01 0.77  CALCIUM 9.4 8.9 8.4*  MG  --  1.8  --    Liver Function Tests: Recent Labs  Lab 10/08/20 2012 10/09/20 0329  AST 153* 127*  ALT 56* 50*  ALKPHOS 52 47  BILITOT 0.5 0.5  PROT 5.9* 5.4*  ALBUMIN 3.1* 2.9*   Recent Labs  Lab 10/08/20 2012  LIPASE 30   No results for input(s): AMMONIA in the last 168 hours. CBC: Recent Labs  Lab 10/08/20 0349 10/09/20 0329  WBC 7.4 7.0  HGB 14.5 13.0  HCT 43.8 38.2*  MCV 94.6 92.7  PLT 213 169   Cardiac Enzymes: Recent Labs  Lab 10/07/20 1642 10/08/20 0349 10/09/20 0329  CKTOTAL 2,870* 16,433* 10,480*   BNP: Invalid input(s): POCBNP CBG: No results for input(s): GLUCAP in the last 168 hours. D-Dimer No results for input(s): DDIMER in the last 72 hours. Hgb A1c No results for input(s): HGBA1C in the last 72 hours. Lipid Profile No results for input(s): CHOL, HDL, LDLCALC,  TRIG, CHOLHDL, LDLDIRECT in the last 72 hours. Thyroid function studies Recent Labs    10/08/20 0349  TSH 2.668   Anemia work up No results for input(s): VITAMINB12, FOLATE, FERRITIN, TIBC, IRON, RETICCTPCT in the last 72 hours. Urinalysis    Component Value Date/Time   COLORURINE YELLOW 10/08/2020 0102   APPEARANCEUR HAZY (A) 10/08/2020 0102   LABSPEC 1.017 10/08/2020 0102   PHURINE 6.0 10/08/2020 0102   GLUCOSEU NEGATIVE 10/08/2020 0102   HGBUR MODERATE (A) 10/08/2020 0102   BILIRUBINUR NEGATIVE 10/08/2020 0102   KETONESUR NEGATIVE 10/08/2020 0102   PROTEINUR 100 (A) 10/08/2020 0102   NITRITE NEGATIVE 10/08/2020 0102   LEUKOCYTESUR NEGATIVE 10/08/2020 0102   Sepsis Labs Invalid input(s): PROCALCITONIN,  WBC,  LACTICIDVEN Microbiology Recent Results (from the past  240 hour(s))  Resp Panel by RT-PCR (Flu A&B, Covid) Nasopharyngeal Swab     Status: None   Collection Time: 10/08/20 12:44 AM   Specimen: Nasopharyngeal Swab; Nasopharyngeal(NP) swabs in vial transport medium  Result Value Ref Range Status   SARS Coronavirus 2 by RT PCR NEGATIVE NEGATIVE Final    Comment: (NOTE) SARS-CoV-2 target nucleic acids are NOT DETECTED.  The SARS-CoV-2 RNA is generally detectable in upper respiratory specimens during the acute phase of infection. The lowest concentration of SARS-CoV-2 viral copies this assay can detect is 138 copies/mL. A negative result does not preclude SARS-Cov-2 infection and should not be used as the sole basis for treatment or other patient management decisions. A negative result may occur with  improper specimen collection/handling, submission of specimen other than nasopharyngeal swab, presence of viral mutation(s) within the areas targeted by this assay, and inadequate number of viral copies(<138 copies/mL). A negative result must be combined with clinical observations, patient history, and epidemiological information. The expected result is Negative.  Fact Sheet for Patients:  BloggerCourse.com  Fact Sheet for Healthcare Providers:  SeriousBroker.it  This test is no t yet approved or cleared by the [place] FDA and  has been authorized for detection and/or diagnosis of SARS-CoV-2 by FDA under an Emergency Use Authorization (EUA). This EUA will remain  in effect (meaning this test can be used) for the duration of the COVID-19 declaration under Section 564(b)(1) of the Act, 21 U.S.C.section 360bbb-3(b)(1), unless the authorization is terminated  or revoked sooner.       Influenza A by PCR NEGATIVE NEGATIVE Final   Influenza B by PCR NEGATIVE NEGATIVE Final    Comment: (NOTE) The Xpert Xpress SARS-CoV-2/FLU/RSV plus assay is intended as an aid in the diagnosis of influenza  from Nasopharyngeal swab specimens and should not be used as a sole basis for treatment. Nasal washings and aspirates are unacceptable for Xpert Xpress SARS-CoV-2/FLU/RSV testing.  Fact Sheet for Patients: BloggerCourse.com  Fact Sheet for Healthcare Providers: SeriousBroker.it  This test is not yet approved or cleared by the [place] FDA and has been authorized for detection and/or diagnosis of SARS-CoV-2 by FDA under an Emergency Use Authorization (EUA). This EUA will remain in effect (meaning this test can be used) for the duration of the COVID-19 declaration under Section 564(b)(1) of the Act, 21 U.S.C. section 360bbb-3(b)(1), unless the authorization is terminated or revoked.  Performed at [facility], [address], [place], [place] [address]      Time coordinating discharge: Over 33 minutes  SIGNED:   Ky Barban, MD  [facility] 10/09/2020, 2:51 PM Pager   If 7PM-7AM, please contact night-coverage [email or web address] Password [email or web address]

## 2020-10-09 NOTE — TOC Initial Note (Addendum)
Transition of Care [facility]) - Initial/Assessment Note    Patient Details  Name: Jordan Ryan MRN: [id number] Date of Birth: [birth year]  Transition of Care [facility]) CM/SW Contact:    Jordan Plan, RN Phone Number: 10/09/2020, 11:55 AM  Clinical Narrative:                  Patient from home with wife. Spoke to both at bedside.   Patient's PCP Is Jordan Jordan Ryan at [facility]. Patient gets his medications filled through the [place].   [facility] called [facility] spoke to [facility] , she will message Jordan Jordan Ryan to make her aware patient is in hospital and will need a follow up.   Jordan Jordan Ryan fax [phone].  [facility]'s [facility] social worker is Jordan Ryan 336 [phone] ext [phone]. Spoke with Jordan Ryan. Johnson requested NCM to fax ha nd P , progress notes and prescriptions etc to Jordan Jordan Ryan . Jordan Ryan clinic will call patient to schedule a follow up . Messaged attending and bedside nurse   [facility] pharmacy phone [phone] , or [phone] ext [phone] fax [phone]   Called [facility] notification line ref ID [phone].  Notes and prescriptions faxed to Jordan Jordan Ryan and [facility] pharmacy .  TOC Pharmacy pricing medications also   TOC provided 14 days free of medications  Expected Discharge Ryan: Home/Self Care Barriers to Discharge: Continued Medical Work up   Patient Goals and CMS Choice Patient states their goals for this hospitalization and ongoing recovery are:: to return to home CMS Medicare.gov Compare Post Acute Care list provided to:: Patient    Expected Discharge Ryan and Services Expected Discharge Ryan: Home/Self Care In-house Referral: NA Discharge Planning Services: CM Consult   Living arrangements for the past 2 months: Single Family Home                 DME Arranged: N/A         HH Arranged: NA          Prior Living Arrangements/Services Living arrangements for the past 2 months: Single Family Home Lives with:: Spouse Patient language and need for interpreter  reviewed:: Yes Do you feel safe going back to the place where you live?: Yes      Need for Family Participation in Patient Care: Yes (Comment) Care giver support system in place?: Yes (comment)   Criminal Activity/Legal Involvement Pertinent to Current Situation/Hospitalization: No - Comment as needed  Activities of Daily Living Home Assistive Devices/Equipment: None ADL Screening (condition at time of admission) Patient's cognitive ability adequate to safely complete daily activities?: Yes Is the patient deaf or have difficulty hearing?: No Does the patient have difficulty seeing, even when wearing glasses/contacts?: No Does the patient have difficulty concentrating, remembering, or making decisions?: Yes Patient able to express need for assistance with ADLs?: Yes Does the patient have difficulty dressing or bathing?: No Independently performs ADLs?: Yes (appropriate for developmental age) Does the patient have difficulty walking or climbing stairs?: No Weakness of Legs: None Weakness of Arms/Hands: None  Permission Sought/Granted   Permission granted to share information with : Yes, Verbal Permission Granted  Share Information with NAME: [facility] Ryan           Emotional Assessment Appearance:: Appears stated age Attitude/Demeanor/Rapport: Engaged Affect (typically observed): Accepting Orientation: : Oriented to Self,Oriented to Place,Oriented to  Time,Oriented to Situation Alcohol / Substance Use: Not Applicable Psych Involvement: No (comment)  Admission diagnosis:  Seizure [facility]) [R56.9] Patient Active Problem List   Diagnosis Date Noted  . Seizure (HCC) 10/08/2020  . Anxiety 03/28/2017  . Depression 03/28/2017   PCP:  Patient, No Pcp Per Pharmacy:   [id number] - [place], [place] - [address] [place] [address] Phone: [phone] Fax: [phone]     Social Determinants of Health  (SDOH) Interventions    Readmission Risk Interventions No flowsheet data found.

## 2020-10-09 NOTE — Progress Notes (Signed)
Subjective: No further seizure-like episodes overnight   ROS: negative except above  Examination  Vital signs in last 24 hours: Temp:  [98.2 F (36.8 C)-98.6 F (37 C)] 98.4 F (36.9 C) (01/13 1156) Pulse Rate:  [66-100] 66 (01/13 1156) Resp:  [16-20] 17 (01/13 1156) BP: (120-162)/(69-87) 146/83 (01/13 1156) SpO2:  [96 %-99 %] 97 % (01/13 1156)  General: lying in bed, no apparent distress CVS: pulse-normal rate and rhythm RS: breathing comfortably, CTA B Extremities: normal, warm Neuro: AOx3, cranial nerves II to XII grossly intact, 5/5 in all extremities  Basic Metabolic Panel: Recent Labs  Lab 10/07/20 1615 10/08/20 0349 10/09/20 0329  NA 137 137 139  K 4.1 3.8 3.3*  CL 100 102 104  CO2 21* 24 25  GLUCOSE 82 100* 107*  BUN 8 7 <5*  CREATININE 1.49* 1.01 0.77  CALCIUM 9.4 8.9 8.4*  MG  --  1.8  --     CBC: Recent Labs  Lab 10/08/20 0349 10/09/20 0329  WBC 7.4 7.0  HGB 14.5 13.0  HCT 43.8 38.2*  MCV 94.6 92.7  PLT 213 169     Coagulation Studies: No results for input(s): LABPROT, INR in the last 72 hours.  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 Associated faint calcification or chronic blood products with the mass, which does demonstrate faint surrounding midbrain FLAIR hyperintensity, although no convincing obstructive hydrocephalus at this time. Recommend Neurosurgery consultation with broad differential considerations including inclusion cysts (such as an atypical colloid, midline dermoid), post infectious etiology (cysticercosis), and less likely neoplastic (pilocytic astrocytoma).  Chronic microhemorrhage or parenchymal calcification also suggested in the anterior right temporal lobe, but otherwise the MRI appearance of the brain is within normal limits.        ASSESSMENT AND PLAN: 40 year old male with second episode of generalized tonic-clonic seizure.  Also reported visual aura( colorful portals in the right side of his vision that were worsening over the course of several seconds) prior to generalization this time.  Epilepsy -Patient has had 2 seizures so far.  The first seizure was attributed to starting Wellbutrin as well as alcohol use.  However, this time it is unlikely that alcohol use was the only cause for the seizure.  The focal onset also makes him more likely to have more seizures in future  Recommendations -Routine EEG did not show any evidence of epileptogenicity. -Cystic-appearing mass on MRI is unlikely the cause of patient's seizure.  There is a lesion which is possibly microhemorrhage parenchymal calcification in the anterior right temporal lobe which could potentially be the source of patient's seizures and can be evaluated further if patient's seizures are not controlled on antiseizure medications -Agree with valproic acid as started by Dr. Iver Nestle.  It is a relatively low dose and therefore if patient has any further auras or seizures, this can be titrated up -Recommend follow-up with neurology in 6 to 8 weeks -Recommend seizure precautions including do not drive for 6 months   Seizure precautions: Per [facility] statutes, patients with seizures are not allowed to drive until they have been seizure-free for six months and cleared by a physician    Use caution when using heavy equipment or power tools. Avoid working on ladders or at heights. Take showers instead of baths. Ensure the water temperature is not too high  on the home water heater. Do not go swimming alone. Do not lock yourself in a room alone (i.e. bathroom). When caring for infants or small children, sit down when holding, feeding, or changing them to minimize risk of injury to the child in the event you have a seizure. Maintain good sleep hygiene. Avoid  alcohol.    If patient has another seizure, call 911 and bring them back to the ED if: A.  The seizure lasts longer than 5 minutes.      B.  The patient doesn't wake shortly after the seizure or has new problems such as difficulty seeing, speaking or moving following the seizure C.  The patient was injured during the seizure D.  The patient has a temperature over 102 F (39C) E.  The patient vomited during the seizure and now is having trouble breathing    During the Seizure   - First, ensure adequate ventilation and place patients on the floor on their left side  Loosen clothing around the neck and ensure the airway is patent. If the patient is clenching the teeth, do not force the mouth open with any object as this can cause severe damage - Remove all items from the surrounding that can be hazardous. The patient may be oblivious to what's happening and may not even know what he or she is doing. If the patient is confused and wandering, either gently guide him/her away and block access to outside areas - Reassure the individual and be comforting - Call 911. In most cases, the seizure ends before EMS arrives. However, there are cases when seizures may last over 3 to 5 minutes. Or the individual may have developed breathing difficulties or severe injuries. If a pregnant patient or a person with diabetes develops a seizure, it is prudent to call an ambulance. - Finally, if the patient does not regain full consciousness, then call EMS. Most patients will remain confused for about 45 to 90 minutes after a seizure, so you must use judgment in calling for help. - Avoid restraints but make sure the patient is in a bed with padded side rails - Place the individual in a lateral position with the neck slightly flexed; this will help the saliva drain from the mouth and prevent the tongue from falling backward - Remove all nearby furniture and other hazards from the area - Provide verbal assurance as the  individual is regaining consciousness - Provide the patient with privacy if possible - Call for help and start treatment as ordered by the caregiver    After the Seizure (Postictal Stage)   After a seizure, most patients experience confusion, fatigue, muscle pain and/or a headache. Thus, one should permit the individual to sleep. For the next few days, reassurance is essential. Being calm and helping reorient the person is also of importance.   Most seizures are painless and end spontaneously. Seizures are not harmful to others but can lead to complications such as stress on the lungs, brain and the heart. Individuals with prior lung problems may develop labored breathing and respiratory distress.   I have spent a total of  35  minutes with the patient reviewing hospital notes,  test results, labs and examining the patient as well as establishing an assessment and plan that was discussed personally with the patient and wife at bedside.  > 50% of time was spent in direct patient care.   Lindie Spruce Epilepsy [facility] For questions after 5pm please refer to [facility]  to reach the Neurologist on call

## 2020-10-09 NOTE — Progress Notes (Signed)
Discharge teaching complete. Meds, diet, activity, follow up appointments reviewed and all questions answered. Meds brought to bedside by [facility] pharmacy. Patient discharged home with wife.

## 2020-10-09 NOTE — Progress Notes (Signed)
Nutrition Brief Note  Patient identified on the Malnutrition Screening Tool (MST) Report  Wt Readings from Last 15 Encounters:  10/08/20 108 kg  08/06/19 104.3 kg  12/28/18 99.3 kg  10/30/18 99.3 kg  02/17/18 107 kg  12/05/17 106.1 kg  03/28/17 95.3 kg  03/07/17 96.2 kg    Body mass index is 33.19 kg/m. Patient meets criteria for class I obesity based on current BMI.   Current diet order is regular diet with thin liquids. Pt reports having a sore mouth and tongue, however has been eating well. Pt reports he has been eating well PTA with usual consumption of 3 meals a day with no difficulties. Labs and medications reviewed.   No nutrition interventions warranted at this time. If nutrition issues arise, please consult RD. Pt reports plans to discharge home today.   Roslyn Smiling, MS, RD, LDN RD pager number/after hours weekend pager number on Amion.

## 2021-04-01 ENCOUNTER — Other Ambulatory Visit: Payer: Self-pay | Admitting: Neurosurgery

## 2021-04-01 DIAGNOSIS — G9389 Other specified disorders of brain: Secondary | ICD-10-CM

## 2021-04-11 ENCOUNTER — Other Ambulatory Visit: Payer: No Typology Code available for payment source

## 2022-01-06 ENCOUNTER — Other Ambulatory Visit (HOSPITAL_COMMUNITY): Payer: Self-pay | Admitting: Neurology

## 2022-01-06 ENCOUNTER — Other Ambulatory Visit: Payer: Self-pay | Admitting: Neurology

## 2022-01-06 DIAGNOSIS — R9089 Other abnormal findings on diagnostic imaging of central nervous system: Secondary | ICD-10-CM

## 2022-01-18 ENCOUNTER — Ambulatory Visit (HOSPITAL_COMMUNITY)
Admission: RE | Admit: 2022-01-18 | Discharge: 2022-01-18 | Disposition: A | Discharge status: Home / Self Care | Payer: No Typology Code available for payment source | Source: Ambulatory Visit | Attending: Neurology | Admitting: Neurology

## 2022-01-18 DIAGNOSIS — R9089 Other abnormal findings on diagnostic imaging of central nervous system: Secondary | ICD-10-CM | POA: Diagnosis present

## 2022-01-18 IMAGING — MR MR HEAD W/O CM
6 of 11 series · 24 of 48 positions shown · non-contrast
Comparison: [DATE] MRI [DATE] CT head

CLINICAL DATA: Abnormal finding on prior MRI

EXAM:
MRI HEAD WITHOUT CONTRAST
TECHNIQUE: Multiplanar, multiecho pulse sequences of the brain and surrounding
structures were obtained without intravenous contrast.

[Series 2: DWI · axial · 3.0mm · 0.94mm/px · z∈[-63,+93]mm · 6 of 106 slices shown (1 of 2)]
[im 1/106]
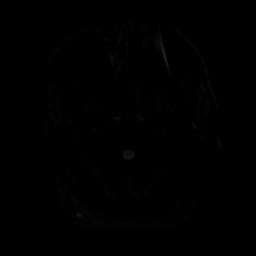
[im 22/106]
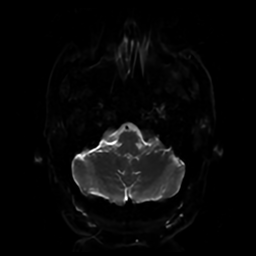
[im 43/106]
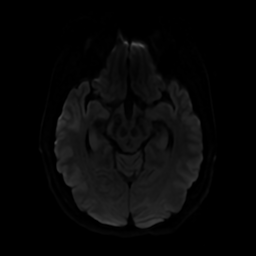
[im 64/106]
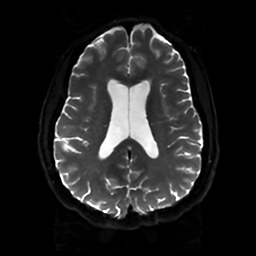
[im 85/106]
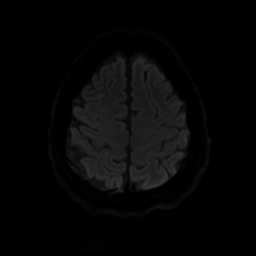
[im 106/106]
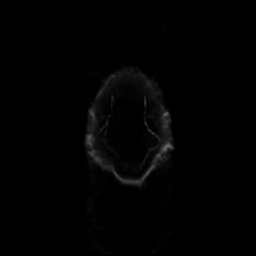

[Series 3: DWI · coronal · 4.0mm · 0.94mm/px · 6 of 79 slices shown (2 of 2)]
[im 1/79]
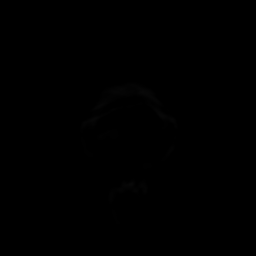
[im 16/79]
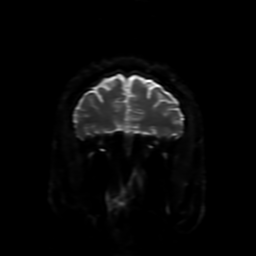
[im 32/79]
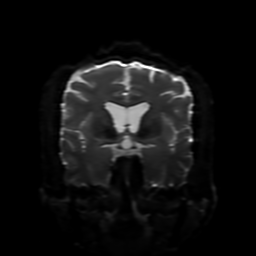
[im 47/79]
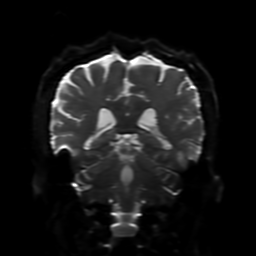
[im 63/79]
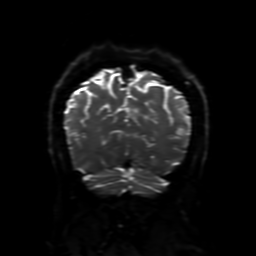
[im 79/79]
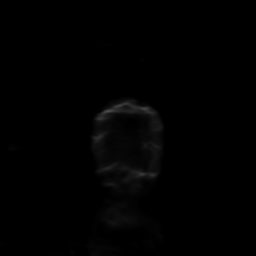

[Series 4: FLAIR · sagittal · 5.0mm · 0.23mm/px · 2 of 26 slices shown (1 of 2)]
[im 1/26]
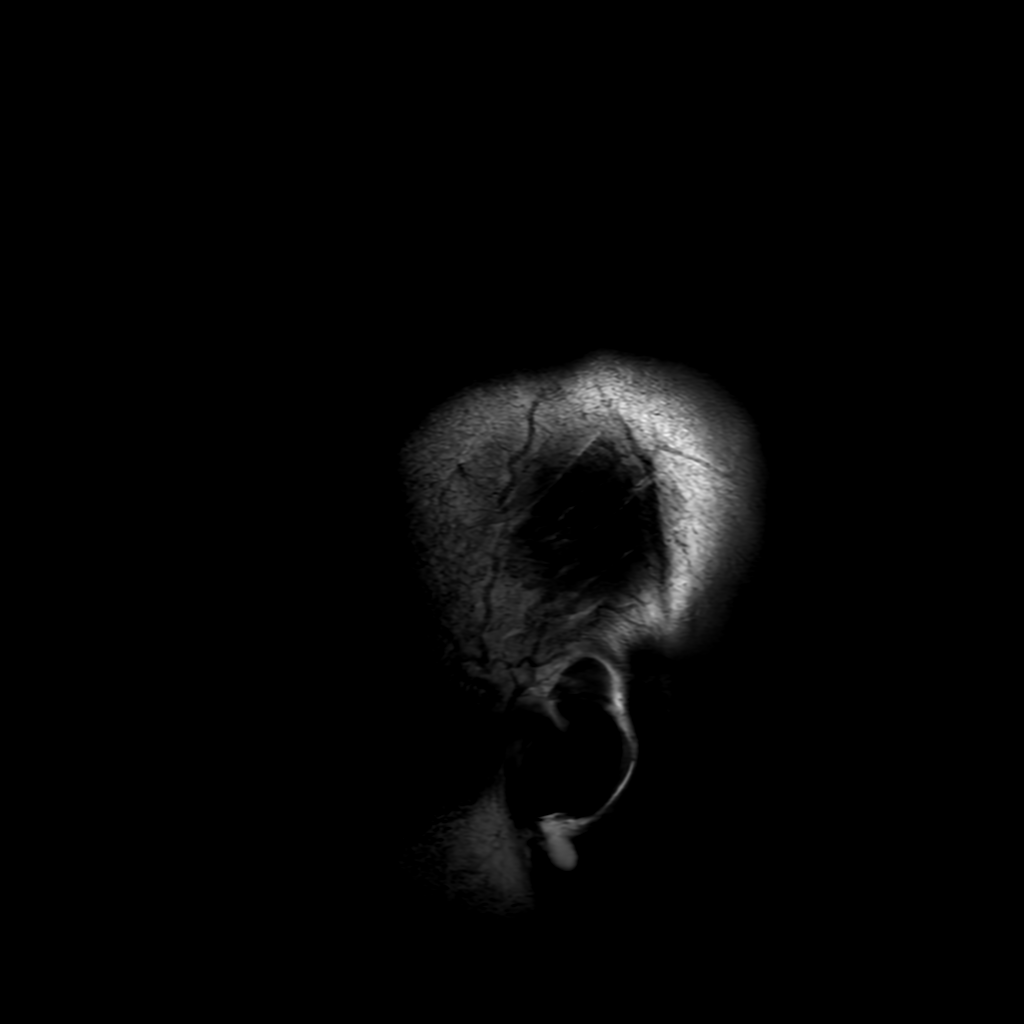
[im 26/26]
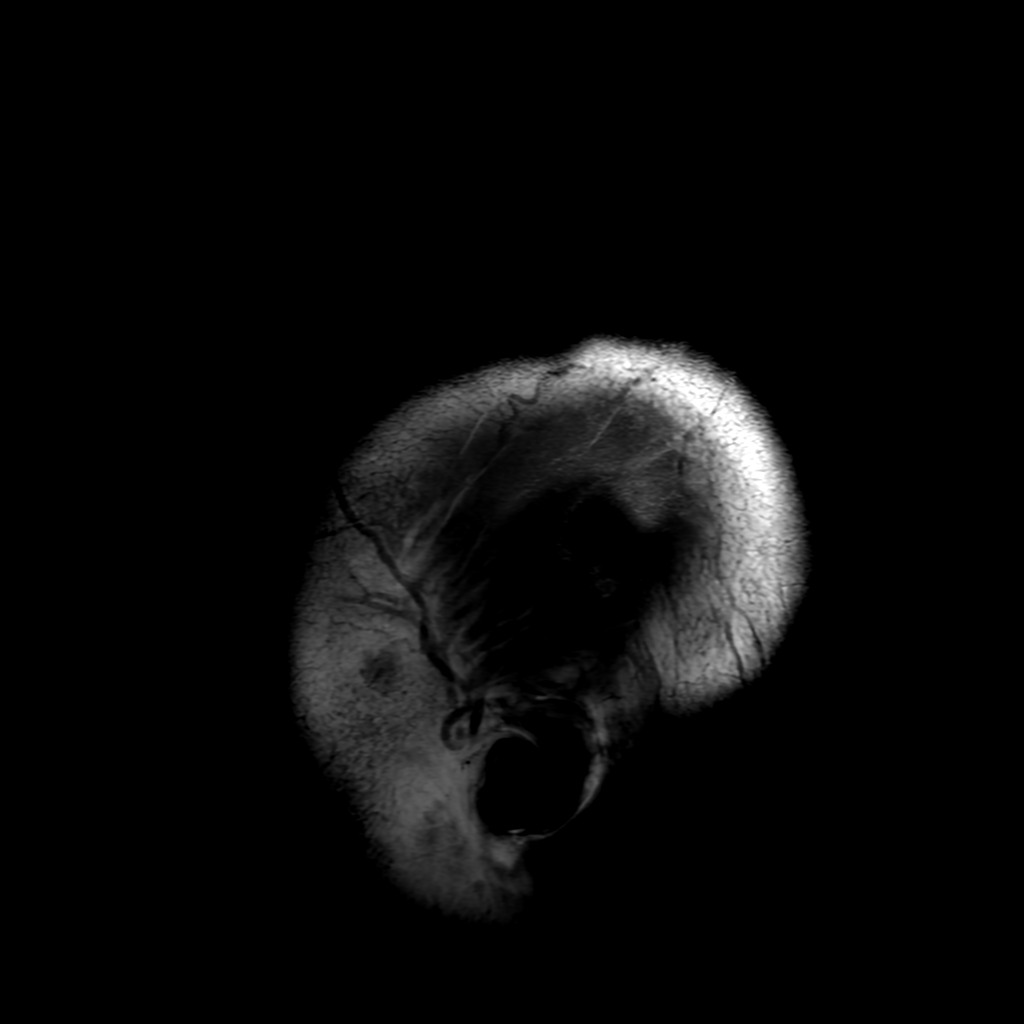

[Series 6: FLAIR · axial · 4.0mm · 0.47mm/px · z∈[-63,+91]mm · 3 of 36 slices shown (2 of 2)]
[im 1/36]
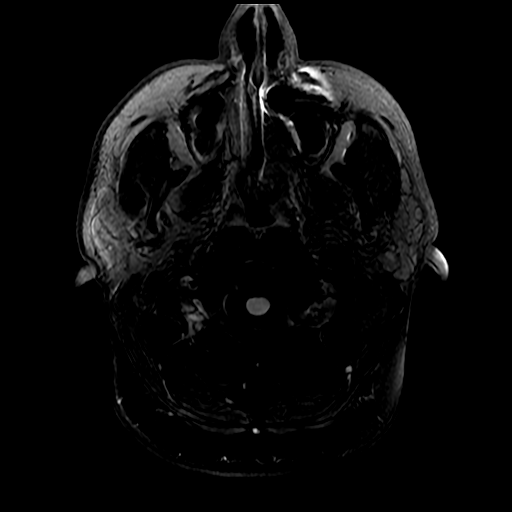
[im 18/36]
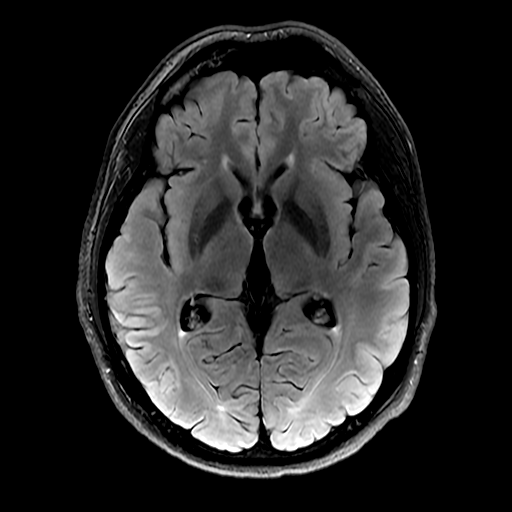
[im 36/36]
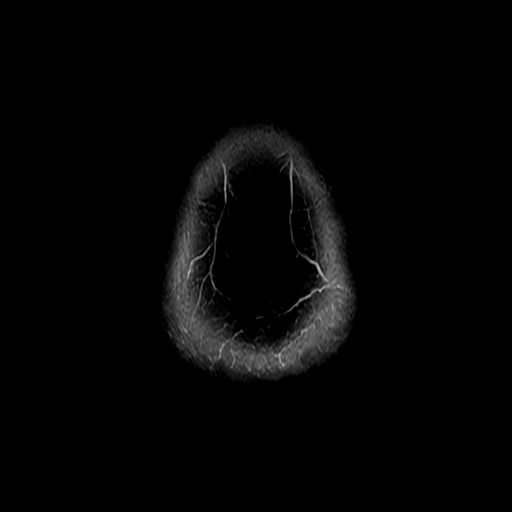

[Series 250: ADC · axial · 3.0mm · 0.94mm/px · z∈[-63,+93]mm · 4 of 53 slices shown (1 of 2)]
[im 1/53]
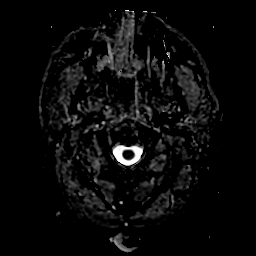
[im 18/53]
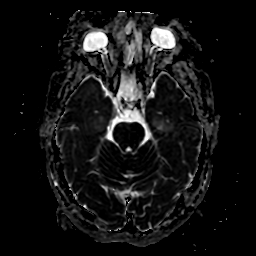
[im 35/53]
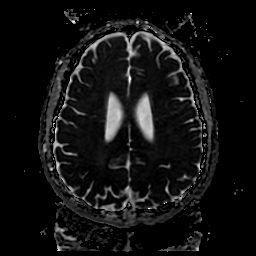
[im 53/53]
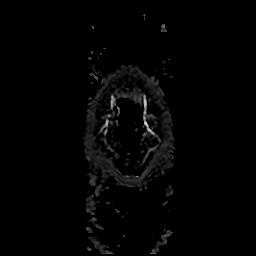

[Series 350: ADC · coronal · 4.0mm · 0.94mm/px · 3 of 40 slices shown (2 of 2)]
[im 1/40]
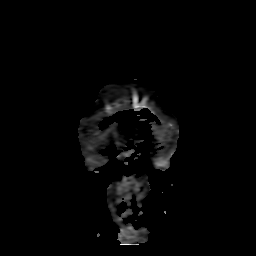
[im 20/40]
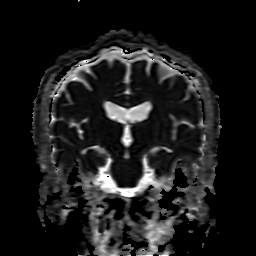
[im 40/40]
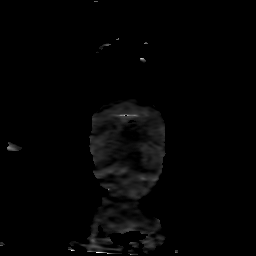

[24 of 48 positions shown; findings below may reference images not displayed]

FINDINGS: Brain: No restricted diffusion to suggest acute or subacute infarct.
No acute hemorrhage, mass mass effect, or midline shift. No
hydrocephalus or extra-axial collection.

Redemonstrated T2 hyperintense, somewhat cystic appearing lesion at
the junction of the third ventricle and cerebral aqueduct, which
measures approximately 6 mm in diameter, unchanged from the prior
exam. Redemonstrated associated susceptibility artifact from which
may represent blood products or calcification. Mild surrounding T2
hyperintensity in the dorsal midbrain likely mild mass effect.

Vascular: Normal flow voids.

Skull and upper cervical spine: Normal marrow signal.

Sinuses/Orbits: Mucosal thickening in the maxillary sinuses and
ethmoid air cells.

Other: The mastoids are well aerated.
IMPRESSION: 1. Redemonstrated 6 mm cystic appearing mass at the junction of the
third ventricle and aqueduct of Sylvius which demonstrates mild mass
effect on the surrounding midbrain no evidence of obstructive
hydrocephalus. This remains nonspecific and may represent an
inclusion cyst, such as in atypical colloid or midline dermoid, with
neoplastic process felt to be less likely given stability.
2. No acute intracranial process.
